# Patient Record
Sex: Male | Born: 1965 | Smoking: Never smoker
Health system: Southern US, Community
[De-identification: ages and names within clinical notes are randomized; demographics above are authoritative.]

---

## 2017-07-04 ENCOUNTER — Ambulatory Visit: Payer: Self-pay

## 2017-07-04 ENCOUNTER — Ambulatory Visit: Payer: BLUE CROSS/BLUE SHIELD | Admitting: Sports Medicine

## 2017-07-04 ENCOUNTER — Encounter: Payer: Self-pay | Admitting: Sports Medicine

## 2017-07-04 VITALS — BP 130/82 | HR 57 | Ht 68.0 in | Wt 163.2 lb

## 2017-07-04 DIAGNOSIS — M25522 Pain in left elbow: Secondary | ICD-10-CM | POA: Diagnosis not present

## 2017-07-04 DIAGNOSIS — M722 Plantar fascial fibromatosis: Secondary | ICD-10-CM

## 2017-07-04 DIAGNOSIS — M7702 Medial epicondylitis, left elbow: Secondary | ICD-10-CM | POA: Diagnosis not present

## 2017-07-04 MED ORDER — NITROGLYCERIN 0.2 MG/HR TD PT24: MEDICATED_PATCH | TRANSDERMAL | 1 refills | Status: DC

## 2017-07-04 NOTE — Progress Notes (Signed)
Veverly FellsMichael D. Delorise Shinerigby, DO  [facility] [place] [phone]  Burnard LeighGlenn Ponzo - 52 y.o. male MRN [id number]  Date of birth: 11-10-1965  Visit Date: 07/04/2017  PCP: Cheral BayHawks, Aldene N, MD   Referred by: No ref. provider found   Scribe for today's visit: Stevenson ClinchBrandy Coleman, CMA     SUBJECTIVE:  Burnard LeighGlenn Dewalt is here for New Patient (Initial Visit) (foot pain)  His foot pain symptoms INITIALLY: Began about 1 yr ago. He is a runner. He broke his foot in April 2018.  Described as mild burning, radiating to heel Worsened with standing.  Improved with rest and elevation.  Additional associated symptoms include: Pt denies swelling around the foot/ankle.     At this time symptoms show no change compared to onset. He has been using compression, heat and ice with minimal relief.   Last Xray done July 2018, Dr. Elijah Birkom foot and ankle.    His LT elbow pain symptoms INITIALLY: Began August 2018 and began while swimming.  Described as mild aching, nonradiating Worsened with swimming Improved with rest Additional associated symptoms include: Pt denies decreased ROM and swelling.     At this time symptoms show no change compared to onset  He has not taken any OTC meds for the pain.    ROS Denies night time disturbances. Denies fevers, chills, or night sweats. Denies unexplained weight loss. Denies personal history of cancer. Denies changes in bowel or bladder habits. Denies recent unreported falls. Denies new or worsening dyspnea or wheezing. Denies headaches or dizziness.  Denies numbness, tingling or weakness  In the extremities.  Denies dizziness or presyncopal episodes Denies lower extremity edema     HISTORY & PERTINENT PRIOR DATA:  Prior History reviewed and updated per electronic medical record.  Significant history, findings, studies and interim changes include:  reports that  has never smoked. he has never used smokeless tobacco. No results for  input(s): HGBA1C, LABURIC, CREATINE in the last 8760 hours. No specialty comments available. Problem  Plantar Fascia Syndrome  Medial Epicondylitis of Left Elbow    OBJECTIVE:  VS:  HT:5\' 8"  (172.7 cm)   WT:163 lb 3.2 oz (74 kg)  BMI:24.82    BP:130/82  HR:(!) 57bpm  TEMP: ( )  RESP:96 %   PHYSICAL EXAM: Constitutional: WDWN, Non-toxic appearing. Psychiatric: Alert & appropriately interactive.  Not depressed or anxious appearing. Respiratory: No increased work of breathing.  Trachea Midline Eyes: Pupils are equal.  EOM intact without nystagmus.  No scleral icterus  NEUROVASCULAR exam: No clubbing or cyanosis appreciated No significant venous stasis changes Capillary Refill: normal, less than 2 seconds   Lower extremities are well aligned without significant edema.  DP PT pulses 2/4. Marked pain across the plantar aspect of the foot at the origin of the plantar fascia right greater than left.  There is a small amount of swelling over this area as well.  Dorsiflexion to 115 degrees bilaterally.  No significant pain with palpation of the Achilles.  Left upper extremity overall well aligned with good grip strength although painful with wrist extension and pain with textbook testing.  Marked pain over the lateral epicondyle.   ASSESSMENT & PLAN:   1. Left elbow pain   2. Medial epicondylitis of left elbow   3. Plantar fascia syndrome    ++++++++++++++++++++++++++++++++++++++++++++ Orders & Meds:  Orders Placed This Encounter  Procedures  . Misc procedure  . US LIMITED JOINT SPACE STRUCTURES UP LEFT(NO LINKED CHARGES)  Meds ordered this encounter  Medications  . nitroGLYCERIN (NITRODUR - DOSED IN MG/24 HR) 0.2 mg/hr patch    Sig: Place 1/4 to 1/2 of a patch over affected region. Remove and replace once daily.  Slightly alter skin placement daily    Dispense:  30 patch    Refill:  1    For musculoskeletal purposes.  Okay to cut patch.      ++++++++++++++++++++++++++++++++++++++++++++ PLAN:   No additional findings.  Plantar fascia syndrome Plantar fasciitis bilaterally right greater than left with underlying fat pad contusion/iatrogenic split. Therapeutic exercises reviewed long discussion today regarding appropriate treatment including compression, [name] exercises, icing and appropriate arch support.  He has over-the-counter arch support that provided significant benefit for him and he will continue with this at this time.  Can consider custom cushioned insoles if needed.  Medial epicondylitis of left elbow Fairly classic medial epicondylosis.  Nitro protocol and therapeutic exercises reviewed    Follow-up: Return in about 6 weeks (around 08/15/2017).   Pertinent documentation may be included in additional procedure notes, imaging studies, problem based documentation and patient instructions. Please see these sections of the encounter for additional information regarding this visit. CMA/ATC served as Neurosurgeon during this visit. History, Physical, and Plan performed by medical provider. Documentation and orders reviewed and attested to.      Andrena Mews, DO    [facility]

## 2017-07-04 NOTE — Patient Instructions (Addendum)
Continue with the compression and padding for your heel with the waffle heel cups  Do the exercises we reviewed for your calf muscles on a daily basis Exercises for your elbow are the Hammer exercises.  Placed quarter of the patch over the area of her elbow that is painful.   Nitroglycerin Protocol  Apply 1/4 nitroglycerin patch to affected area daily.  Change position of patch within the affected area every 24 hours.  You may experience a headache during the first 1-2 weeks of using the patch, these should subside.  If you experience headaches after beginning nitroglycerin patch treatment, you may take your preferred over the counter pain reliever.  Another side effect of the nitroglycerin patch is skin irritation or rash related to patch adhesive.  Please notify our office if you develop more severe headaches or rash, and stop the patch.  Tendon healing with nitroglycerin patch may require 12 to 24 weeks depending on the extent of injury.  Men should not use if taking Viagra, Cialis, or Levitra.   Do not use if you have migraines or rosacea.    Please perform the exercise program that we have prepared for you and gone over in detail on a daily basis.  In addition to the handout you were provided you can access your program through: [email or web address]   Your unique program code is: [id number] & [id number]

## 2017-08-03 ENCOUNTER — Encounter: Payer: Self-pay | Admitting: Sports Medicine

## 2017-08-07 DIAGNOSIS — M722 Plantar fascial fibromatosis: Secondary | ICD-10-CM | POA: Insufficient documentation

## 2017-08-07 DIAGNOSIS — M7702 Medial epicondylitis, left elbow: Secondary | ICD-10-CM | POA: Insufficient documentation

## 2017-08-07 NOTE — Assessment & Plan Note (Signed)
Plantar fasciitis bilaterally right greater than left with underlying fat pad contusion/iatrogenic split. Therapeutic exercises reviewed long discussion today regarding appropriate treatment including compression, [name] exercises, icing and appropriate arch support.  He has over-the-counter arch support that provided significant benefit for him and he will continue with this at this time.  Can consider custom cushioned insoles if needed.

## 2017-08-07 NOTE — Procedures (Signed)
LIMITED MSK ULTRASOUND OF FEET Images were obtained and interpreted by myself, Gaspar BiddingMichael Zakiah Beckerman, DO  Images have been saved and stored to PACS system. Images obtained on: GE S7 Ultrasound machine  FINDINGS:   Bilateral feet were examined with ultrasound revealed a significantly thickened right plantar fascia measuring 0.64 cm in greatest diameter.    There is also significant hypoechoic change within the fat pad and an apparent intrasubstance split consistent with fat pad split (stone bruise)  Left plantar fascia was measured at 0.41cm  IMPRESSION:  1. Bilateral plantar fasciitis 2. Right fat pad split

## 2017-08-07 NOTE — Procedures (Signed)
PROCEDURE NOTE: THERAPEUTIC EXERCISES (97110) 15 minutes spent for Therapeutic exercises as below and as referenced in the AVS. This included exercises focusing on stretching, strengthening, with significant focus on eccentric aspects.  Proper technique shown and discussed handout in great detail with ATC. All questions were discussed and answered.   Long term goals include an improvement in range of motion, strength, endurance as well as avoiding reinjury. Frequency of visits is one time as determined during today's  office visit. Frequency of exercises to be performed is as per handout.  EXERCISES REVIEWED:  [name] exercises  Achilles stretching  Tennis elbow strengthening including Hammer exercises and icing

## 2017-08-07 NOTE — Assessment & Plan Note (Signed)
Fairly classic medial epicondylosis.  Nitro protocol and therapeutic exercises reviewed

## 2017-08-15 ENCOUNTER — Encounter: Payer: Self-pay | Admitting: Sports Medicine

## 2017-08-15 ENCOUNTER — Ambulatory Visit: Payer: BLUE CROSS/BLUE SHIELD | Admitting: Sports Medicine

## 2017-08-15 ENCOUNTER — Ambulatory Visit: Payer: Self-pay

## 2017-08-15 VITALS — BP 130/80 | HR 48 | Ht 68.0 in | Wt 163.6 lb

## 2017-08-15 DIAGNOSIS — M722 Plantar fascial fibromatosis: Secondary | ICD-10-CM | POA: Diagnosis not present

## 2017-08-15 DIAGNOSIS — M7702 Medial epicondylitis, left elbow: Secondary | ICD-10-CM

## 2017-08-15 DIAGNOSIS — L909 Atrophic disorder of skin, unspecified: Secondary | ICD-10-CM

## 2017-08-15 DIAGNOSIS — M25522 Pain in left elbow: Secondary | ICD-10-CM | POA: Diagnosis not present

## 2017-08-15 NOTE — Procedures (Signed)
LIMITED MSK ULTRASOUND OF LEFT MEDIAL ELBOW & RIGHT PF Images were obtained and interpreted by myself, Gaspar BiddingMichael Briana Farner, DO  Images have been saved and stored to PACS system. Images obtained on: GE S7 Ultrasound machine  FINDINGS:   R Plantar Fascia = improved swelling within the fat pad and still measuring >0.52cm  Medial elbow with persistent hypo-echoic change at the origin of the flexor tendons. IMproved neovascularity  IMPRESSION:  1. R Plantar Fascitis with overlying fat pad contusion likely iatrogenic 2. Medial Epicondylosis, improving

## 2017-08-15 NOTE — Patient Instructions (Signed)

## 2017-08-15 NOTE — Progress Notes (Signed)
Mitchell Franco. [facility] at [facility] [phone]  Mitchell Franco - 52 y.o. male MRN [id number]  Date of birth: Nov 04, 1965  Visit Date: 08/15/2017  PCP: Cheral Bay, MD   Referred by: Cheral Bay, MD   Scribe for today's visit: Stevenson Clinch, CMA     SUBJECTIVE:  Mitchell Franco is here for Follow-up (L elbow pain)   07/04/17: His foot pain symptoms INITIALLY: Began about 1 yr ago. He is a runner. He broke his foot in April 2018.  Described as mild burning, radiating to heel Worsened with standing.  Improved with rest and elevation.  Additional associated symptoms include: Pt denies swelling around the foot/ankle.  At this time symptoms show no change compared to onset. He has been using compression, heat and ice with minimal relief.  Last Xray done July 2018, Dr. Elijah Birk foot and ankle.   His LT elbow pain symptoms INITIALLY: Began August 2018 and began while swimming.  Described as mild aching, nonradiating Worsened with swimming Improved with rest Additional associated symptoms include: Pt denies decreased ROM and swelling.  At this time symptoms show no change compared to onset  He has not taken any OTC meds for the pain.   08/15/17: Compared to the last office visit, his previously described symptoms are improving, still trigger after swimming.  Current symptoms are mild & are nonradiating He has been using Nitroglycerin patches, 1/4 patch, no HA or skin irritation. He has been doing HEP exercises with no trouble.   Compared to the last office visit, his previously described symptoms are improving, pain has mostly resolved, he is able to run with minimal pain.  Current symptoms are mild & are nonradiating He has been alternating heat and ice, doing [name] exercises and has arch support in shoes.    ROS Denies night time disturbances. Denies fevers, chills, or night sweats. Denies unexplained weight loss. Denies  personal history of cancer. Denies changes in bowel or bladder habits. Denies recent unreported falls. Denies new or worsening dyspnea or wheezing. Denies headaches or dizziness.  Denies numbness, tingling or weakness  In the extremities.  Denies dizziness or presyncopal episodes Denies lower extremity edema    HISTORY & PERTINENT PRIOR DATA:  Prior History reviewed and updated per electronic medical record.  Significant history, findings, studies and interim changes include:  reports that  has never smoked. he has never used smokeless tobacco. No results for input(s): HGBA1C, LABURIC, CREATINE in the last 8760 hours. No specialty comments available. No problems updated.  OBJECTIVE:  VS:  HT:5\' 8"  (172.7 cm)   WT:163 lb 9.6 oz (74.2 kg)  BMI:24.88    BP:130/80  HR:(!) 48bpm  TEMP: ( )  RESP:97 %   PHYSICAL EXAM: Constitutional: WDWN, Non-toxic appearing. Psychiatric: Alert & appropriately interactive.  Not depressed or anxious appearing. Respiratory: No increased work of breathing.  Trachea Midline Eyes: Pupils are equal.  EOM intact without nystagmus.  No scleral icterus  NEUROVASCULAR exam: No clubbing or cyanosis appreciated No significant venous stasis changes Capillary Refill: normal, less than 2 seconds   Left elbow: He has persistent small amount of pain with palpation of the medial epicondyles.  His strength with resisted wrist flexion elbow flexion extension is normal and essentially pain-free.  Ligamentously his elbow is stable. Right foot: Moderate TTP over the lateral aspect of the plantar fascial origin.  Overall significantly improved.  Improved dorsiflexion with straight and bent knee.  Ankle exam  is ligamentously stable with good intrinsic ankle strength.  Breakdown of the transverse arch is present with slight splay toe.  ASSESSMENT & PLAN:   1. Medial epicondylitis of left elbow   2. Plantar fascia syndrome   3. Left elbow pain   4. Plantar fat pad  atrophy    ++++++++++++++++++++++++++++++++++++++++++++ Orders & Meds:  Orders Placed This Encounter  Procedures  . US MSK POCT ULTRASOUND   No orders of the defined types were placed in this encounter.   ++++++++++++++++++++++++++++++++++++++++++++ PLAN: Overall he is done significantly better these past 6 weeks.  I am encouraged with how things appear on ultrasound however am cautious to have him discontinue therapeutic exercises and nitroglycerin protocol given the persistent findings.  I would like for him to continue this for an additional 6 weeks and plan to check in with him at that time and I am optimistic that we will be able to have him return to normal activities. With only minimal maintenance exercises at that time.  Follow-up: Return in about 6 weeks (around 09/26/2017) for repeat clinical exam, repeat diagnostic ultrasound.   Pertinent documentation may be included in additional procedure notes, imaging studies, problem based documentation and patient instructions. Please see these sections of the encounter for additional information regarding this visit. CMA/ATC served as Neurosurgeonscribe during this visit. History, Physical, and Plan performed by medical provider. Documentation and orders reviewed and attested to.      Andrena MewsMichael D Rigby, DO    [facility]

## 2017-08-16 ENCOUNTER — Encounter: Payer: Self-pay | Admitting: Sports Medicine

## 2017-09-26 ENCOUNTER — Encounter: Payer: Self-pay | Admitting: Sports Medicine

## 2017-09-26 ENCOUNTER — Ambulatory Visit: Payer: BLUE CROSS/BLUE SHIELD | Admitting: Sports Medicine

## 2017-09-26 VITALS — BP 110/76 | HR 51 | Ht 68.0 in | Wt 159.2 lb

## 2017-09-26 DIAGNOSIS — M7702 Medial epicondylitis, left elbow: Secondary | ICD-10-CM

## 2017-09-26 DIAGNOSIS — M25522 Pain in left elbow: Secondary | ICD-10-CM | POA: Diagnosis not present

## 2017-09-26 DIAGNOSIS — M722 Plantar fascial fibromatosis: Secondary | ICD-10-CM | POA: Diagnosis not present

## 2017-09-26 DIAGNOSIS — L909 Atrophic disorder of skin, unspecified: Secondary | ICD-10-CM | POA: Diagnosis not present

## 2017-09-26 DIAGNOSIS — M9907 Segmental and somatic dysfunction of upper extremity: Secondary | ICD-10-CM

## 2017-09-26 NOTE — Progress Notes (Signed)
[name]. [name] [facility] at [facility] [phone]  Matas Burrows - 52 y.o. male MRN [id number]  Date of birth: [birth year]  Visit Date: 09/26/2017  PCP: Cheral Bay, MD   Referred by: Cheral Bay, MD  Scribe for today's visit: Stevenson Clinch, CMA     SUBJECTIVE:  Mitchell Franco is here for Follow-up (L elbow pain, plantar fasciitis)  07/04/17: His foot pain symptoms INITIALLY: Began about 1 yr ago. He is a runner. He broke his foot in April 2018.  Described as mild burning, radiating to heel Worsened with standing.  Improved with rest and elevation.  Additional associated symptoms include: Pt denies swelling around the foot/ankle.  At this time symptoms show no change compared to onset. He has been using compression, heat and ice with minimal relief.  Last Xray done July 2018, Dr. Elijah Birk foot and ankle.   His LT elbow pain symptoms INITIALLY: Began August 2018 and began while swimming.  Described as mild aching, nonradiating Worsened with swimming Improved with rest Additional associated symptoms include: Pt denies decreased ROM and swelling.  At this time symptoms show no change compared to onset  He has not taken any OTC meds for the pain.   08/15/17: Compared to the last office visit, his previously described symptoms are improving, still trigger after swimming.  Current symptoms are mild & are nonradiating He has been using Nitroglycerin patches, 1/4 patch, no HA or skin irritation. He has been doing HEP exercises with no trouble.   Compared to the last office visit, his previously described symptoms are improving, pain has mostly resolved, he is able to run with minimal pain.  Current symptoms are mild & are nonradiating He has been alternating heat and ice, doing [name] exercises and has arch support in shoes.   09/26/17: Compared to the last office visit, his previously described symptoms of L elbow pain is improving.  Pain is triggered by swimming.  Current symptoms are mild & are nonradiating He has been following Nitro Protocol. He denies HA but has noticed a slight rash at placement site. He has been doing HEP intermittently.   Compared to the last office visit, his previously described symptoms (of bilateral foot pain) show no change. He has been running more recently.  Current symptoms are mild & are nonradiating He has been  Alternating heat and ice when needed. He has been doing [name] exercises and has arch support in his shoes.   ROS Denies night time disturbances. Denies fevers, chills, or night sweats. Denies unexplained weight loss. Denies personal history of cancer. Denies changes in bowel or bladder habits. Denies recent unreported falls. Denies new or worsening dyspnea or wheezing. Denies headaches or dizziness.  Denies numbness, tingling or weakness  In the extremities.  Denies dizziness or presyncopal episodes Denies lower extremity edema    HISTORY & PERTINENT PRIOR DATA:  Prior History reviewed and updated per electronic medical record.  Significant/pertinent history, findings, studies include:  reports that he has never smoked. He has never used smokeless tobacco. No results for input(s): HGBA1C, LABURIC, CREATINE in the last 8760 hours. No specialty comments available. No problems updated.  OBJECTIVE:  VS:  HT:5\' 8"  (172.7 cm)   WT:159 lb 3.2 oz (72.2 kg)  BMI:24.21    BP:110/76  HR:(Abnormal) 51bpm  TEMP: ( )  RESP:96 %   PHYSICAL EXAM: Constitutional: WDWN, Non-toxic appearing. Psychiatric: Alert & appropriately interactive.  Not depressed or anxious appearing.  Respiratory: No increased work of breathing.  Trachea Midline Eyes: Pupils are equal.  EOM intact without nystagmus.  No scleral icterus  Vascular Exam: warm to touch no edema  upper and lower extremity neuro exam: unremarkable normal strength normal sensation normal reflexes  MSK  Exam: Bilateral elbows overall well aligned.  Has a moderate degree of pain with palpation of the medial epicondyle and flexor tendons on the left greater than right arms.  Osteopathic findings per procedure note His bilateral Achilles are nontender and he has a overall good improvement with his plantar fascial pain.   ASSESSMENT & PLAN:   1. Somatic dysfunction of upper extremity     PLAN:>50% of this 25 minute visit spent in direct patient counseling and/or coordination of care.  Discussion was focused on education regarding the in discussing the pathoetiology and anticipated clinical course of the above condition.  Overall he has made good improvements over the past several months and has learned what he can and cannot do from activity modification standpoint.  Links to [employer] videos provided today per Patient Instructions.  These exercises were developed by Myles LippsEric Goodman, DC with a strong emphasis on core neuromuscular reducation and postural realignment through body-weight exercises.  Follow-up: Return in about 8 weeks (around 11/21/2017).       Please see additional documentation for Objective, Assessment and Plan sections. Pertinent additional documentation may be included in corresponding procedure notes, imaging studies, problem based documentation and patient instructions. Please see these sections of the encounter for additional information regarding this visit.  CMA/ATC served as Neurosurgeonscribe during this visit. History, Physical, and Plan performed by medical provider. Documentation and orders reviewed and attested to.      Andrena MewsMichael D Luma Clopper, DO    [facility] Physician

## 2017-09-26 NOTE — Progress Notes (Signed)
PROCEDURE NOTE : OSTEOPATHIC MANIPULATION The decision today to treat with Osteopathic Manipulative Therapy (OMT) was based on physical exam findings. Verbal consent was obtained following a discussion with the patient regarding the of risks, benefits and potential side effects, including an acute pain flare,post manipulation soreness and need for repeat treatments.     NONE  Manipulation was performed as below: Regions treated: Upper extremities OMT Techniques Used: HVLA, muscle energy and myofascial release  The patient tolerated the treatment well and reported Improved symptoms following treatment today. Patient was given medications, exercises, stretches and lifestyle modifications per AVS and verbally.   OSTEOPATHIC/STRUCTURAL EXAM:   Hyperflexed elbow with supinated hand position. Anterior radial head on the right and left.

## 2017-09-26 NOTE — Patient Instructions (Signed)

## 2017-10-20 ENCOUNTER — Encounter: Payer: Self-pay | Admitting: Sports Medicine

## 2017-11-02 ENCOUNTER — Other Ambulatory Visit: Payer: Self-pay | Admitting: Sports Medicine

## 2017-11-21 ENCOUNTER — Ambulatory Visit: Payer: BLUE CROSS/BLUE SHIELD | Admitting: Sports Medicine

## 2018-06-15 ENCOUNTER — Encounter: Payer: Self-pay | Admitting: Sports Medicine

## 2018-06-15 ENCOUNTER — Ambulatory Visit: Payer: Self-pay

## 2018-06-15 ENCOUNTER — Ambulatory Visit: Payer: BLUE CROSS/BLUE SHIELD | Admitting: Sports Medicine

## 2018-06-15 VITALS — BP 124/80 | HR 55 | Ht 68.0 in | Wt 162.6 lb

## 2018-06-15 DIAGNOSIS — M722 Plantar fascial fibromatosis: Secondary | ICD-10-CM

## 2018-06-15 DIAGNOSIS — M25522 Pain in left elbow: Secondary | ICD-10-CM

## 2018-06-15 DIAGNOSIS — M7702 Medial epicondylitis, left elbow: Secondary | ICD-10-CM

## 2018-06-15 NOTE — Progress Notes (Signed)
Mitchell Franco. [name] at [facility] [phone]  Mitchell Franco - 53 y.o. male MRN [id number]  Date of birth: [birth year]  Visit Date:   PCP: Cheral Bay, MD   Referred by: Cheral Bay, MD   SUBJECTIVE:  Chief Complaint  Patient presents with  . Follow-up    R foot pain and L elbow pain   HPI: Patient presents for recurrent left elbow pain that has been worsening since increasing his swimming.  He is having an aching type pain.  He has used the nitroglycerin patches in the past and resumed this 5 days ago.  He did have good relief with nitroglycerin patches initially but since returning to the pool aggressively this is flared back up.  His right plantar fascia also has been improving and he only has minimal pain at this time.  REVIEW OF SYSTEMS: Per HPI   HISTORY:  Prior history reviewed and updated per electronic medical record.  Social History   Occupational History  . Not on file  Tobacco Use  . Smoking status: Never Smoker  . Smokeless tobacco: Never Used  Substance and Sexual Activity  . Alcohol use: Not on file  . Drug use: Not on file  . Sexual activity: Not on file   Social History   Social History Narrative  . Not on file    DATA OBTAINED & REVIEWED:  No results for input(s): HGBA1C, LABURIC, CREATINE, CALCIUM, AST, ALT, TSH in the last 8760 hours.  Invalid input(s): MAGNESIUM, CK No problems updated. No specialty comments available.  OBJECTIVE:  VS:  HT:5\' 8"  (172.7 cm)   WT:162 lb 9.6 oz (73.8 kg)  BMI:24.73    BP:124/80  HR:(!) 55bpm  TEMP: ( )  RESP:99 %   PHYSICAL EXAM: Adult male.  No acute distress.  Alert and appropriate. Left elbow with persistent pain along the medial epicondyle.  He has pain with resisted wrist flexion.  Ulnar deviation and radial deviation of the wrist does not cause any pain.  No significant pain with wrist extension.  There is fairly focal tightness within the  common flexor tendons as well as some fibrous changes appreciated.  No palpable defect appreciated.  There is marked tenderness along the medial calcaneus. Achilles is supple and Achilles is nontender.  Good motor control with dorsiflexion, plantarflexion, inversion and eversion.   ASSESSMENT  1. Left elbow pain   2. Plantar fascia syndrome   3. Medial epicondylitis of left elbow     PLAN:  Pertinent additional documentation may be included in corresponding procedure notes, imaging studies, problem based documentation and patient instructions. No problem-specific Assessment & Plan notes found for this encounter.  Overall his Achilles and plantar fascia are doing significantly better.  He does have markedly tight common flexor tendons on the left and will likely benefit from formal physical therapy with dry needling.  Referral for this was placed today.  He will resume nitroglycerin protocol.  Procedures:  None  No orders of the defined types were placed in this encounter.   Imaging Orders     [place] MSK POCT ULTRASOUND  Referral Orders     Ambulatory referral to Physical Therapy  Further Discussion/Instructions:  Activity modifications and the importance of avoiding exacerbating activities (limiting pain to no more than a 4 / 10 during or following activity) recommended and discussed. Discussed red flag symptoms that warrant earlier emergent evaluation and patient voices understanding.  If any lack  of improvement: consider Biologic Therapy with PRP   Return in about 6 weeks (around 07/27/2018).           Andrena MewsMichael D Dajsha Massaro, DO    [facility] Physician

## 2018-06-23 ENCOUNTER — Encounter: Payer: Self-pay | Admitting: Sports Medicine

## 2018-06-23 NOTE — Procedures (Signed)
LIMITED MSK ULTRASOUND OF Left elbow Images were obtained and interpreted by myself, Gaspar Bidding, DO  Images have been saved and stored to PACS system. Images obtained on: GE S7 Ultrasound machine  FINDINGS:   Common flexor tendon musculature has fragmentation at the insertion on the medial epicondyle.  Minimal neovascularity within this region.  Hypoechoic changes found within the common extensor tendon.  IMPRESSION:  1. Medial epicondylosis  LIMITED MSK ULTRASOUND OF Right plantar fascia. Images were obtained and interpreted by myself, Gaspar Bidding, DO  Images have been saved and stored to PACS system. Images obtained on: GE S7 Ultrasound machine  FINDINGS:   Overall improved thickness of the plantar fascia at the origin of the calcaneus however just distal to the osseous insertion there is still marked swelling measuring up to 0.84 cm.  Longitudinal arch appears to thinned out nicely compared to previously.  IMPRESSION:  2. 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

## 2018-07-04 ENCOUNTER — Ambulatory Visit (INDEPENDENT_AMBULATORY_CARE_PROVIDER_SITE_OTHER): Payer: BLUE CROSS/BLUE SHIELD | Admitting: Physical Therapy

## 2018-07-04 ENCOUNTER — Encounter: Payer: Self-pay | Admitting: Physical Therapy

## 2018-07-04 DIAGNOSIS — M25522 Pain in left elbow: Secondary | ICD-10-CM

## 2018-07-04 NOTE — Patient Instructions (Signed)
Access Code: [email or web address]  URL: [email or web address]  Date: 07/04/2018  Prepared by: Sedalia Muta   Exercises  Wrist Extension Stretch Pronated - 3 reps - 30 hold - 3x daily  Seated Wrist Flexion Extension PROM - 3 reps - 30 hold - 3x daily  Standing Wrist Extension Stretch - 3 reps - 30 hold - 3x daily  Wrist Prayer Stretch - 3 reps - 30 hold - 3x daily

## 2018-07-04 NOTE — Therapy (Signed)
[facility] [address] [place], [place], [address] Phone: [phone]   Fax:  [phone]  Physical Therapy Evaluation  Patient Details  Name: Mitchell Franco MRN: [id number] Date of Birth: [birth year] Referring Provider (PT): Danelle Earthlyigby   Encounter Date: 07/04/2018  PT End of Session - 07/04/18 1222    Visit Number  1    Number of Visits  12    Date for PT Re-Evaluation  08/15/18    Authorization Type  BCBS    PT Start Time  1215    PT Stop Time  1253    PT Time Calculation (min)  38 min    Activity Tolerance  Patient tolerated treatment well    Behavior During Therapy  [facility] for tasks assessed/performed       History reviewed. No pertinent past medical history.  History reviewed. No pertinent surgical history.  There were no vitals filed for this visit.   Subjective Assessment - 07/04/18 1218    Subjective  Pt states increased pain in l elbow, for about 1 year. He broke foot, and started swimming more. Has had increased pain recently. 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 He states minimal pain at work, but does have pain with gripping. He also swims, and bikes.     Limitations  Lifting;Writing;House hold activities;Sitting    Patient Stated Goals  Decreased pain     Currently in Pain?  Yes    Pain Score  6     Pain Location  Elbow    Pain Orientation  Left;Medial    Pain Descriptors / Indicators  Aching    Pain Type  Chronic pain    Pain Onset  More than a month ago    Pain Frequency  Intermittent         [facility] PT Assessment - 07/04/18 0001      Assessment   Medical Diagnosis  L elbow Pain    Referring Provider (PT)  Berline Choughigby    Hand Dominance  Right    Prior Therapy  no      Balance Screen   Has the patient fallen in the past 6 months  No      Prior Function   Level of Independence  Independent      Cognition   Overall Cognitive Status  Within Functional Limits for tasks assessed      ROM /  Strength   AROM / PROM / Strength  AROM;Strength      AROM   Overall AROM Comments  Shoulder: WNL; L  Elbow: mild limitation for full extension due to pain;  L wrist: mild limitation for extension due to pain;       Strength   Overall Strength Comments  Shoulder: 4+/5; Elbow: 4/5; Wrist: 4/5      Palpation   Palpation comment  Significant pain at medial flexor wod, Mild pain at lateral epicondyle;       Special Tests   Other special tests  Painful resited wrist flexion, pain with full wrist extension; Pain with gripping;                 Objective measurements completed on examination: See above findings.      [facility] Adult PT Treatment/Exercise - 07/04/18 0001      Exercises   Exercises  Elbow;Wrist      Wrist Exercises   Other wrist exercises  Wrist extension and flexion stretches, Elbow straight,  30 sec x2 each on L;  Prayer stretch 30 sec x3;       Modalities   Modalities  Iontophoresis      Iontophoresis   Type of Iontophoresis  Dexamethasone    Location  L medial elbow    Time  4 hour patch      Manual Therapy   Manual Therapy  Passive ROM;Soft tissue mobilization    Soft tissue mobilization  DTM and IASTM to L medial elbow    Passive ROM  L elbow extension and L wrist extension             PT Education - 07/04/18 1222    Education Details  PT POC, HEP    Person(s) Educated  Patient    Methods  Explanation;Handout    Comprehension  Verbalized understanding;Need further instruction       PT Short Term Goals - 07/04/18 1257      PT SHORT TERM GOAL #1   Title  Pt to be independent with initial HEP    Time  2    Period  Weeks    Status  New    Target Date  07/18/18        PT Long Term Goals - 07/04/18 1258      PT LONG TERM GOAL #1   Title  Pt to report decreased pain in elbow to 0-2/10 with activity    Time  6    Period  Weeks    Status  New    Target Date  08/15/18      PT LONG TERM GOAL #2   Title  Pt to demo decreased soft  tissue restrictions in L forearm ,to be WNL for decreased pain and improved mobiltiy    Time  6    Period  Weeks    Status  New    Target Date  08/15/18      PT LONG TERM GOAL #3   Title  Pt to be independent with final HEP     Time  6    Period  Weeks    Status  New    Target Date  08/15/18             Plan - 07/04/18 1301    Clinical Impression Statement  Pt presents with primary complaint of increased pain in L medial elbow. He has tightness in wrist flexors, bicep, and Pec. He has pain with passive wrist extension at medial elbow. Pt with lack of effective HEP for his dx. He has mild weakness in foearm and grip due to pain. Pt with decreased ability for full functional activites and work duties, due to pain. Pt to benefit from skilled PT to improve pain and return to PLOF.     Clinical Presentation  Stable    Clinical Decision Making  Low    Rehab Potential  Good    PT Frequency  2x / week    PT Duration  6 weeks    PT Treatment/Interventions  ADLs/Self Care Home Management;Cryotherapy;Electrical Stimulation;Iontophoresis 4mg /ml Dexamethasone;Functional mobility training;Ultrasound;Moist Heat;Therapeutic activities;Therapeutic exercise;Neuromuscular re-education;Patient/family education;Passive range of motion;Manual techniques;Dry needling;Taping    Consulted and Agree with Plan of Care  Patient       Patient will benefit from skilled therapeutic intervention in order to improve the following deficits and impairments:  Increased muscle spasms, Decreased activity tolerance, Pain, Impaired flexibility, Decreased strength  Visit Diagnosis: Left elbow pain     Problem List Patient Active Problem  List   Diagnosis Date Noted  . Plantar fascia syndrome 08/07/2017  . Medial epicondylitis of left elbow 08/07/2017    Sedalia Muta, PT, DPT 1:09 PM  07/04/18    [facility] [address] [place], [place], [address] Phone:  [phone]   Fax:  [phone]  Name: Mitchell Franco MRN: [id number] Date of Birth: [birth year]

## 2018-07-11 ENCOUNTER — Encounter: Payer: Self-pay | Admitting: Physical Therapy

## 2018-07-11 ENCOUNTER — Ambulatory Visit (INDEPENDENT_AMBULATORY_CARE_PROVIDER_SITE_OTHER): Payer: BLUE CROSS/BLUE SHIELD | Admitting: Physical Therapy

## 2018-07-11 DIAGNOSIS — M25522 Pain in left elbow: Secondary | ICD-10-CM | POA: Diagnosis not present

## 2018-07-11 NOTE — Therapy (Signed)
[facility] [address] [place], [place], [address] Phone: [phone]   Fax:  [phone]  Physical Therapy Treatment  Patient Details  Name: Mitchell Franco MRN: [id number] Date of Birth: [birth year] Referring Provider (PT): Danelle Earthly Date: 07/11/2018  PT End of Session - 07/11/18 2149    Visit Number  2    Number of Visits  12    Date for PT Re-Evaluation  08/15/18    Authorization Type  BCBS    PT Start Time  1432    PT Stop Time  1513    PT Time Calculation (min)  41 min    Activity Tolerance  Patient tolerated treatment well    Behavior During Therapy  [facility] for tasks assessed/performed       History reviewed. No pertinent past medical history.  History reviewed. No pertinent surgical history.  There were no vitals filed for this visit.  Subjective Assessment - 07/11/18 2148    Subjective  Pt states mild soreness today    Currently in Pain?  Yes    Pain Score  4     Pain Location  Elbow    Pain Orientation  Left;Medial;Lateral    Pain Descriptors / Indicators  Aching    Pain Type  Chronic pain    Pain Onset  More than a month ago    Pain Frequency  Intermittent                       [facility] Adult PT Treatment/Exercise - 07/11/18 2143      Exercises   Exercises  Elbow;Wrist      Wrist Exercises   Other wrist exercises  Wrist extension and flexion stretches, Elbow straight, 30 sec x2 each on L;      Other wrist exercises  Supine medial nerve glide x20;  Seated Ulnar nerve glide x20;       Modalities   Modalities  Iontophoresis      Iontophoresis   Type of Iontophoresis  Dexamethasone    Location  --    Time  --      Manual Therapy   Manual Therapy  Passive ROM;Soft tissue mobilization;Joint mobilization    Joint Mobilization  elbow distraction     Soft tissue mobilization  DTM and IASTM to L medial and lateral elbow    Passive ROM  L elbow extension and L wrist extension stretching.         Trigger Point Dry Needling - 07/11/18 2146    Consent Given?  Yes    Education Handout Provided  Yes    Muscles Treated Upper Body  --   ECRB;  Pronator, flexor carpi radialis            PT Short Term Goals - 07/04/18 1257      PT SHORT TERM GOAL #1   Title  Pt to be independent with initial HEP    Time  2    Period  Weeks    Status  New    Target Date  07/18/18        PT Long Term Goals - 07/04/18 1258      PT LONG TERM GOAL #1   Title  Pt to report decreased pain in elbow to 0-2/10 with activity    Time  6    Period  Weeks    Status  New    Target Date  08/15/18  PT LONG TERM GOAL #2   Title  Pt to demo decreased soft tissue restrictions in L forearm ,to be WNL for decreased pain and improved mobiltiy    Time  6    Period  Weeks    Status  New    Target Date  08/15/18      PT LONG TERM GOAL #3   Title  Pt to be independent with final HEP     Time  6    Period  Weeks    Status  New    Target Date  08/15/18            Plan - 07/11/18 2151    Clinical Impression Statement  Pt with sigificant pain with palpation of medial flexors and lateral extensors. Dry needling and manual therapy done today for pain and tightness. Reviewed stretches and added nerve glides. Plan to progress as tolerated.     Rehab Potential  Good    PT Frequency  2x / week    PT Duration  6 weeks    PT Treatment/Interventions  ADLs/Self Care Home Management;Cryotherapy;Electrical Stimulation;Iontophoresis 4mg /ml Dexamethasone;Functional mobility training;Ultrasound;Moist Heat;Therapeutic activities;Therapeutic exercise;Neuromuscular re-education;Patient/family education;Passive range of motion;Manual techniques;Dry needling;Taping    Consulted and Agree with Plan of Care  Patient       Patient will benefit from skilled therapeutic intervention in order to improve the following deficits and impairments:  Increased muscle spasms, Decreased activity tolerance, Pain, Impaired  flexibility, Decreased strength  Visit Diagnosis: Left elbow pain     Problem List Patient Active Problem List   Diagnosis Date Noted  . Plantar fascia syndrome 08/07/2017  . Medial epicondylitis of left elbow 08/07/2017    Sedalia Muta, PT, DPT 9:52 PM  07/11/18    [facility] [address] [place], [place], [address] Phone: [phone]   Fax:  [phone]  Name: Mitchell Franco MRN: [id number] Date of Birth: [birth year]

## 2018-07-12 ENCOUNTER — Ambulatory Visit (INDEPENDENT_AMBULATORY_CARE_PROVIDER_SITE_OTHER): Payer: BLUE CROSS/BLUE SHIELD | Admitting: Physical Therapy

## 2018-07-12 DIAGNOSIS — M25522 Pain in left elbow: Secondary | ICD-10-CM | POA: Diagnosis not present

## 2018-07-15 ENCOUNTER — Encounter: Payer: Self-pay | Admitting: Physical Therapy

## 2018-07-15 NOTE — Therapy (Signed)
[facility] [address] [place], [place], [address] Phone: [phone]   Fax:  [phone]  Physical Therapy Treatment  Patient Details  Name: Mitchell Franco MRN: [id number] Date of Birth: 09/19/1965 Referring Provider (PT): Danelle Earthly Date: 07/12/2018  PT End of Session - 07/15/18 1502    Visit Number  3    Number of Visits  12    Date for PT Re-Evaluation  08/15/18    Authorization Type  BCBS    PT Start Time  0232    PT Stop Time  0310    PT Time Calculation (min)  38 min    Activity Tolerance  Patient tolerated treatment well    Behavior During Therapy  [facility] for tasks assessed/performed       History reviewed. No pertinent past medical history.  History reviewed. No pertinent surgical history.  There were no vitals filed for this visit.  Subjective Assessment - 07/15/18 1501    Subjective  Pt states soreness, more on lateral elbow today.     Limitations  Lifting;Writing;House hold activities;Sitting    Patient Stated Goals  Decreased pain     Currently in Pain?  Yes    Pain Score  3     Pain Location  Elbow    Pain Orientation  Left;Medial;Lateral    Pain Descriptors / Indicators  Aching    Pain Type  Chronic pain    Pain Onset  More than a month ago    Pain Frequency  Intermittent                       [facility] Adult PT Treatment/Exercise - 07/15/18 0001      Exercises   Exercises  Elbow;Wrist      Wrist Exercises   Other wrist exercises  Wrist extension and flexion stretches, Elbow straight, 30 sec x2 each on L;      Other wrist exercises  Supine medial nerve glide x20;  Seated Ulnar nerve glide x20;       Modalities   Modalities  Iontophoresis      Iontophoresis   Type of Iontophoresis  Dexamethasone    Location  L medial elbow    Time  4 hour patch      Manual Therapy   Manual Therapy  Passive ROM;Soft tissue mobilization;Joint mobilization    Joint Mobilization  elbow distraction      Soft tissue mobilization  DTM and IASTM to L medial and lateral elbow    Passive ROM  L elbow extension and L wrist extension stretching.        Trigger Point Dry Needling - 07/15/18 1507    Consent Given?  Yes    Muscles Treated Upper Body  --   ECRB- Left , twitch response elicited.             PT Short Term Goals - 07/04/18 1257      PT SHORT TERM GOAL #1   Title  Pt to be independent with initial HEP    Time  2    Period  Weeks    Status  New    Target Date  07/18/18        PT Long Term Goals - 07/04/18 1258      PT LONG TERM GOAL #1   Title  Pt to report decreased pain in elbow to 0-2/10 with activity    Time  6    Period  Weeks    Status  New    Target Date  08/15/18      PT LONG TERM GOAL #2   Title  Pt to demo decreased soft tissue restrictions in L forearm ,to be WNL for decreased pain and improved mobiltiy    Time  6    Period  Weeks    Status  New    Target Date  08/15/18      PT LONG TERM GOAL #3   Title  Pt to be independent with final HEP     Time  6    Period  Weeks    Status  New    Target Date  08/15/18            Plan - 07/15/18 1505    Clinical Impression Statement  Pt with mild improvement with medial elbow pain. Additional DN done for ECRB today, due to continued soreness. Ionto done for medial elbow. Pt to benefit from continued care.     Rehab Potential  Good    PT Frequency  2x / week    PT Duration  6 weeks    PT Treatment/Interventions  ADLs/Self Care Home Management;Cryotherapy;Electrical Stimulation;Iontophoresis 4mg /ml Dexamethasone;Functional mobility training;Ultrasound;Moist Heat;Therapeutic activities;Therapeutic exercise;Neuromuscular re-education;Patient/family education;Passive range of motion;Manual techniques;Dry needling;Taping    Consulted and Agree with Plan of Care  Patient       Patient will benefit from skilled therapeutic intervention in order to improve the following deficits and impairments:   Increased muscle spasms, Decreased activity tolerance, Pain, Impaired flexibility, Decreased strength  Visit Diagnosis: Left elbow pain     Problem List Patient Active Problem List   Diagnosis Date Noted  . Plantar fascia syndrome 08/07/2017  . Medial epicondylitis of left elbow 08/07/2017   Sedalia Muta, PT, DPT 3:09 PM  07/15/18    [facility] [place] [facility] [address] [place], [place], [address] Phone: [phone]   Fax:  [phone]  Name: Mitchell Franco MRN: [id number] Date of Birth: Apr 28, 1966

## 2018-07-17 ENCOUNTER — Ambulatory Visit (INDEPENDENT_AMBULATORY_CARE_PROVIDER_SITE_OTHER): Payer: BLUE CROSS/BLUE SHIELD | Admitting: Physical Therapy

## 2018-07-17 ENCOUNTER — Encounter: Payer: Self-pay | Admitting: Physical Therapy

## 2018-07-17 DIAGNOSIS — M25522 Pain in left elbow: Secondary | ICD-10-CM

## 2018-07-17 NOTE — Therapy (Signed)
[facility] [address] [place], [place], [address] Phone: [phone]   Fax:  [phone]  Physical Therapy Treatment  Patient Details  Name: Mitchell LeighGlenn Trahan MRN: [id number] Date of Birth: [birth year] Referring Provider (PT): Danelle Earthlyigby   Encounter Date: 07/17/2018  PT End of Session - 07/17/18 1032    Visit Number  4    Number of Visits  12    Date for PT Re-Evaluation  08/15/18    Authorization Type  BCBS    PT Start Time  1022    PT Stop Time  1105    PT Time Calculation (min)  43 min    Activity Tolerance  Patient tolerated treatment well    Behavior During Therapy  [facility] for tasks assessed/performed       History reviewed. No pertinent past medical history.  History reviewed. No pertinent surgical history.  There were no vitals filed for this visit.  Subjective Assessment - 07/17/18 1038    Subjective  Pt states decreasing soreness overall. He did have increased pain wtih work duties today, at lateral elbow.     Patient Stated Goals  Decreased pain     Currently in Pain?  Yes    Pain Score  6     Pain Location  Elbow    Pain Orientation  Left;Medial;Lateral    Pain Descriptors / Indicators  Aching    Pain Type  Chronic pain    Pain Onset  More than a month ago    Pain Frequency  Intermittent                       [facility] Adult PT Treatment/Exercise - 07/17/18 0001      Exercises   Exercises  Elbow;Wrist      Elbow Exercises   Other elbow exercises  Standing Rows GTB x20 for posture;       Wrist Exercises   Wrist Flexion  20 reps    Bar Weights/Barbell (Wrist Flexion)  2 lbs    Wrist Flexion Limitations  eccentrics    Wrist Extension  20 reps    Bar Weights/Barbell (Wrist Extension)  2 lbs    Wrist Extension Limitations  eccentrics    Other wrist exercises  Wrist extension and flexion stretches, Elbow straight, 30 sec x2 each on L;      Other wrist exercises  --      Modalities   Modalities   Iontophoresis      Iontophoresis   Type of Iontophoresis  Dexamethasone    Location  L medial elbow    Time  4 hour patch      Manual Therapy   Manual Therapy  Passive ROM;Soft tissue mobilization;Joint mobilization    Manual therapy comments  skilled palaption and monitoring of softf tissue with dry needling.     Joint Mobilization  elbow distraction     Soft tissue mobilization  DTM and IASTM to L medial and lateral elbow    Passive ROM  L elbow extension and L wrist extension stretching.        Trigger Point Dry Needling - 07/17/18 1254    Consent Given?  Yes    Muscles Treated Upper Body  --   Pronator (medial elbow) on L; increased muscle length            PT Short Term Goals - 07/04/18 1257      PT SHORT TERM GOAL #1   Title  Pt to be independent with initial HEP    Time  2    Period  Weeks    Status  New    Target Date  07/18/18        PT Long Term Goals - 07/04/18 1258      PT LONG TERM GOAL #1   Title  Pt to report decreased pain in elbow to 0-2/10 with activity    Time  6    Period  Weeks    Status  New    Target Date  08/15/18      PT LONG TERM GOAL #2   Title  Pt to demo decreased soft tissue restrictions in L forearm ,to be WNL for decreased pain and improved mobiltiy    Time  6    Period  Weeks    Status  New    Target Date  08/15/18      PT LONG TERM GOAL #3   Title  Pt to be independent with final HEP     Time  6    Period  Weeks    Status  New    Target Date  08/15/18            Plan - 07/17/18 1251    Clinical Impression Statement  Pt with mild improvements in pain. Continues to have pain in pronator with resisted motion, and deep palpation. Addressed with dry needling today Manual therapy done for medial and lateral musculature. Ionto continued for medial pain. Pt started with light eccentric ther ex for pain. Plan to progress as tolerated.     Rehab Potential  Good    PT Frequency  2x / week    PT Duration  6 weeks    PT  Treatment/Interventions  ADLs/Self Care Home Management;Cryotherapy;Electrical Stimulation;Iontophoresis 4mg /ml Dexamethasone;Functional mobility training;Ultrasound;Moist Heat;Therapeutic activities;Therapeutic exercise;Neuromuscular re-education;Patient/family education;Passive range of motion;Manual techniques;Dry needling;Taping    Consulted and Agree with Plan of Care  Patient       Patient will benefit from skilled therapeutic intervention in order to improve the following deficits and impairments:  Increased muscle spasms, Decreased activity tolerance, Pain, Impaired flexibility, Decreased strength  Visit Diagnosis: Left elbow pain     Problem List Patient Active Problem List   Diagnosis Date Noted  . Plantar fascia syndrome 08/07/2017  . Medial epicondylitis of left elbow 08/07/2017    Sedalia Muta, PT, DPT 12:55 PM  07/17/18    [place] [facility] [address] [place], [place], [address] Phone: [phone]   Fax:  [phone]  Name: Lela Gochenaur MRN: [id number] Date of Birth: [birth year]

## 2018-07-19 ENCOUNTER — Ambulatory Visit (INDEPENDENT_AMBULATORY_CARE_PROVIDER_SITE_OTHER): Payer: BLUE CROSS/BLUE SHIELD | Admitting: Physical Therapy

## 2018-07-19 DIAGNOSIS — M25522 Pain in left elbow: Secondary | ICD-10-CM

## 2018-07-22 ENCOUNTER — Encounter: Payer: Self-pay | Admitting: Physical Therapy

## 2018-07-22 NOTE — Therapy (Signed)
[facility] [address] [place], [place], [address] Phone: [phone]   Fax:  [phone]  Physical Therapy Treatment  Patient Details  Name: Mitchell Franco MRN: [id number] Date of Birth: 11/22/1965 Referring Provider (PT): Danelle Earthly Date: 07/19/2018  PT End of Session - 07/22/18 1427    Visit Number  5    Number of Visits  12    Date for PT Re-Evaluation  08/15/18    Authorization Type  BCBS    PT Start Time  1345    PT Stop Time  1432    PT Time Calculation (min)  47 min    Activity Tolerance  Patient tolerated treatment well    Behavior During Therapy  [facility] for tasks assessed/performed       History reviewed. No pertinent past medical history.  History reviewed. No pertinent surgical history.  There were no vitals filed for this visit.  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 He has had decreasing pain overall.     Patient Stated Goals  Decreased pain     Currently in Pain?  Yes    Pain Score  3     Pain Location  Elbow    Pain Orientation  Right;Left    Pain Descriptors / Indicators  Aching    Pain Type  Chronic pain    Pain Onset  More than a month ago    Pain Frequency  Intermittent                       [facility] Adult PT Treatment/Exercise - 07/22/18 0001      Exercises   Exercises  Elbow;Wrist      Elbow Exercises   Other elbow exercises  Standing Rows BlTB x20, low rows GTB x20, scap pull outs YTB x20;       Wrist Exercises   Wrist Flexion  20 reps    Bar Weights/Barbell (Wrist Flexion)  3 lbs    Wrist Extension  20 reps    Bar Weights/Barbell (Wrist Extension)  3 lbs    Other wrist exercises  Wrist extension and flexion stretches, Elbow straight, 30 sec x2 each on L;        Modalities   Modalities  Iontophoresis      Iontophoresis   Type of Iontophoresis  Dexamethasone    Location  L medial  elbow    Time  4 hour patch      Manual Therapy   Manual Therapy  Passive ROM;Soft tissue mobilization;Joint mobilization    Joint Mobilization  elbow distraction     Soft tissue mobilization  DTM and IASTM to L medial and lateral elbow    Passive ROM  L elbow extension and L wrist extension stretching.                PT Short Term Goals - 07/04/18 1257      PT SHORT TERM GOAL #1   Title  Pt to be independent with initial HEP    Time  2    Period  Weeks    Status  New    Target Date  07/18/18        PT Long Term Goals - 07/04/18 1258      PT LONG TERM GOAL #1   Title  Pt to report decreased pain in elbow to  0-2/10 with activity    Time  6    Period  Weeks    Status  New    Target Date  08/15/18      PT LONG TERM GOAL #2   Title  Pt to demo decreased soft tissue restrictions in L forearm ,to be WNL for decreased pain and improved mobiltiy    Time  6    Period  Weeks    Status  New    Target Date  08/15/18      PT LONG TERM GOAL #3   Title  Pt to be independent with final HEP     Time  6    Period  Weeks    Status  New    Target Date  08/15/18            Plan - 07/22/18 1429    Clinical Impression Statement  Pt with decreasing pain wtih palaption and manual therapy. Manual therapy and Ionto continued for pain. Discussed at length work mechanics and forarm positioning for decreased pain. Pt progressing, plan to continue to work on decreasing pain, and improving strength.     Rehab Potential  Good    PT Frequency  2x / week    PT Duration  6 weeks    PT Treatment/Interventions  ADLs/Self Care Home Management;Cryotherapy;Electrical Stimulation;Iontophoresis 4mg /ml Dexamethasone;Functional mobility training;Ultrasound;Moist Heat;Therapeutic activities;Therapeutic exercise;Neuromuscular re-education;Patient/family education;Passive range of motion;Manual techniques;Dry needling;Taping    Consulted and Agree with Plan of Care  Patient       Patient will  benefit from skilled therapeutic intervention in order to improve the following deficits and impairments:  Increased muscle spasms, Decreased activity tolerance, Pain, Impaired flexibility, Decreased strength  Visit Diagnosis: Left elbow pain     Problem List Patient Active Problem List   Diagnosis Date Noted  . Plantar fascia syndrome 08/07/2017  . Medial epicondylitis of left elbow 08/07/2017    Sedalia Muta, PT, DPT 2:32 PM  07/22/18    [facility] [address] [place], [place], [address] Phone: [phone]   Fax:  [phone]  Name: Mitchell Franco MRN: [id number] Date of Birth: Jul 19, 1965

## 2018-07-23 ENCOUNTER — Encounter: Payer: Self-pay | Admitting: Sports Medicine

## 2018-07-23 ENCOUNTER — Ambulatory Visit: Payer: BLUE CROSS/BLUE SHIELD | Admitting: Sports Medicine

## 2018-07-23 VITALS — BP 110/78 | HR 51 | Ht 68.0 in | Wt 162.0 lb

## 2018-07-23 DIAGNOSIS — M722 Plantar fascial fibromatosis: Secondary | ICD-10-CM | POA: Diagnosis not present

## 2018-07-23 DIAGNOSIS — M9907 Segmental and somatic dysfunction of upper extremity: Secondary | ICD-10-CM

## 2018-07-23 DIAGNOSIS — M25522 Pain in left elbow: Secondary | ICD-10-CM

## 2018-07-23 DIAGNOSIS — M7702 Medial epicondylitis, left elbow: Secondary | ICD-10-CM

## 2018-07-23 NOTE — Progress Notes (Signed)
Veverly FellsMichael D. Delorise Shinerigby, DO  [facility] [place] [phone]  Burnard LeighGlenn Neidlinger - 53 y.o. male MRN [id number]  Date of birth: 01/24/1966  Visit Date: July 23, 2018  PCP: Jamal CollinHedgecock, Suzanne, PA-C   Referred by: Cheral BayHawks, Aldene N, MD  SUBJECTIVE:  Chief Complaint  Patient presents with  . Left Elbow - Follow-up  . Right Foot - Follow-up  . Follow-up    R foot pain.  Has arch supports.  [name]'s exercises.    HPI: Patient is here for follow-up of the above.  His right foot is asked doing quite well.  He is continuing to have some tightness and pain with certain motions with the medial aspect of the elbow but is markedly improved and is responded well to dry needling and physical therapy.  He has been over the change how he is performing some of the exacerbating activities and this is been beneficial as well.  He is not having any nighttime disturbances with this.  He is using the nitroglycerin protocol quarter of a patch without significant side effects  REVIEW OF SYSTEMS: No significant nighttime awakenings due to this issue. Denies fevers, chills, recent weight gain or weight loss.  No night sweats.  Pt denies any change in bowel or bladder habits, muscle weakness, numbness or falls associated with this pain.  HISTORY:  Prior history reviewed and updated per electronic medical record.  Patient Active Problem List   Diagnosis Date Noted  . Plantar fascia syndrome 08/07/2017  . Medial epicondylitis of left elbow 08/07/2017   Social History   Occupational History  . Not on file  Tobacco Use  . Smoking status: Never Smoker  . Smokeless tobacco: Never Used  Substance and Sexual Activity  . Alcohol use: Not on file  . Drug use: Not on file  . Sexual activity: Not on file   Social History   Social History Narrative  . Not on file    OBJECTIVE:  VS:  HT:5\' 8"  (172.7 cm)   WT:162 lb (73.5 kg)  BMI:24.64    BP:110/78  HR:(!) 51bpm   TEMP: ( )  RESP:98 %   PHYSICAL EXAM: Adult male. No acute distress.  Alert and appropriate. Left elbow is overall well aligned.  He does have markedly improved tissue texture of the medial forearm.  His pronator teres and bicipital aponeurosis that is causing some fascial adhesions to the common flexor tendons.  No pain with deep palpation of the biceps tendon and negative hook sign.  Mild focal tenderness directly over the medial epicondyle.     ASSESSMENT:   1. Left elbow pain   2. Plantar fascia syndrome   3. Medial epicondylitis of left elbow   4. Somatic dysfunction of upper extremity     PROCEDURES:  PROCEDURE NOTE: OSTEOPATHIC MANIPULATION  The decision today to treat with Osteopathic Manipulative Therapy (OMT) was based on physical exam findings. Verbal consent was obtained following a discussion with the patient regarding the of risks, benefits and potential side effects, including an acute pain flare,post manipulation soreness and need for repeat treatments.   Contraindications to OMT: NONE Manipulation was performed as below: [facility] Treated & Osteopathic Exam Findings UPPER EXTREMITIES: Tissue texture change and hyperflexed left wrist with pain directly over the common flexor tendons.  There is a trigger point within the common flexor tendons as well as the pronator teres.  OMT Techniques Used: HVLA muscle energy myofascial release  The patient tolerated the treatment  well and reported Improved symptoms following treatment today. Patient was given medications, exercises, stretches and lifestyle modifications per AVS and verbally.      PLAN:  Pertinent additional documentation may be included in corresponding procedure notes, imaging studies, problem based documentation and patient instructions.  No problem-specific Assessment & Plan notes found for this encounter.   Overall he is markedly improved.  We will have him continue with the home therapeutic exercises and  finish with physical therapy.  He is responding great to dry needling and and continue to encourage him to work on home massage to help break up some of the fascial adhesions that he has along the medial aspect of the elbow.  Overall this is markedly improved though and he is happy with his progress.  He should continue with his nitroglycerin at this time.  We did discuss the PRP as an option moving forward and if we have continued or incomplete healing he would consider doing this in the off season which is later in the fall.  He will follow-up with us again if any lack of improvement or worsening symptoms.  Continue previously prescribed home exercise program.   Activity modifications and the importance of avoiding exacerbating activities (limiting pain to no more than a 4 / 10 during or following activity) recommended and discussed.  Discussed red flag symptoms that warrant earlier emergent evaluation and patient voices understanding.   No orders of the defined types were placed in this encounter.  Lab Orders  No laboratory test(s) ordered today   Imaging Orders  No imaging studies ordered today   Referral Orders  No referral(s) requested today    No follow-ups on file.          Andrena MewsMichael D Rowan Pollman, DO    [facility]

## 2018-07-24 ENCOUNTER — Encounter: Payer: Self-pay | Admitting: Physical Therapy

## 2018-07-24 ENCOUNTER — Ambulatory Visit (INDEPENDENT_AMBULATORY_CARE_PROVIDER_SITE_OTHER): Payer: BLUE CROSS/BLUE SHIELD | Admitting: Physical Therapy

## 2018-07-24 DIAGNOSIS — M25522 Pain in left elbow: Secondary | ICD-10-CM | POA: Diagnosis not present

## 2018-07-24 NOTE — Therapy (Signed)
[facility] [address] [place], [place], [address] Phone: [phone]   Fax:  [phone]  Physical Therapy Treatment  Patient Details  Name: Mitchell Franco MRN: [id number] Date of Birth: [birth year] Referring Provider (PT): Danelle Earthlyigby   Encounter Date: 07/24/2018  PT End of Session - 07/24/18 1111    Visit Number  6    Number of Visits  12    Date for PT Re-Evaluation  08/15/18    Authorization Type  BCBS    PT Start Time  1103    PT Stop Time  1145    PT Time Calculation (min)  42 min    Activity Tolerance  Patient tolerated treatment well    Behavior During Therapy  [facility] for tasks assessed/performed       History reviewed. No pertinent past medical history.  History reviewed. No pertinent surgical history.  There were no vitals filed for this visit.  Subjective Assessment - 07/24/18 1110    Subjective  PT states improving pain. He has tried to use better mechanics at work. He feels most pain now at lateral elbow    Currently in Pain?  Yes    Pain Score  3     Pain Location  Elbow    Pain Orientation  Right;Left    Pain Descriptors / Indicators  Aching    Pain Type  Chronic pain    Pain Onset  More than a month ago    Aggravating Factors   work, pronation/supination. Elbow flexion                       [facility] Adult PT Treatment/Exercise - 07/24/18 1112      Exercises   Exercises  Elbow;Wrist      Elbow Exercises   Other elbow exercises  Standing Rows BlTB x20,     Other elbow exercises  Doorway stretch 90 and 45 deg 30 sec x2 each;       Wrist Exercises   Wrist Flexion  20 reps    Bar Weights/Barbell (Wrist Flexion)  3 lbs    Wrist Extension  20 reps    Bar Weights/Barbell (Wrist Extension)  3 lbs    Other wrist exercises  Wrist extension and flexion stretches, Elbow straight, 30 sec x2 each on L;  Wrist ext stretch kneeling x1 min;     Other wrist exercises  Self supnation stretch with over pressure 30  sec x3;       Modalities   Modalities  Iontophoresis      Iontophoresis   Type of Iontophoresis  Dexamethasone    Location  L lateral elbow    Time  4 hour patch      Manual Therapy   Manual Therapy  Passive ROM;Soft tissue mobilization;Joint mobilization    Manual therapy comments  skilled palaption and monitoring of softf tissue with dry needling.     Joint Mobilization  elbow distraction , posterior glides,     Soft tissue mobilization  DTM and IASTM to L medial and lateral elbow    Passive ROM  L elbow extension and L wrist extension stretching. , chest stretches, with active nerve glide x20;        Trigger Point Dry Needling - 07/24/18 1328    Consent Given?  Yes    Muscles Treated Upper Body  --   Lateral elbow, wrist extensors: increased muscle length (L)  PT Short Term Goals - 07/04/18 1257      PT SHORT TERM GOAL #1   Title  Pt to be independent with initial HEP    Time  2    Period  Weeks    Status  New    Target Date  07/18/18        PT Long Term Goals - 07/04/18 1258      PT LONG TERM GOAL #1   Title  Pt to report decreased pain in elbow to 0-2/10 with activity    Time  6    Period  Weeks    Status  New    Target Date  08/15/18      PT LONG TERM GOAL #2   Title  Pt to demo decreased soft tissue restrictions in L forearm ,to be WNL for decreased pain and improved mobiltiy    Time  6    Period  Weeks    Status  New    Target Date  08/15/18      PT LONG TERM GOAL #3   Title  Pt to be independent with final HEP     Time  6    Period  Weeks    Status  New    Target Date  08/15/18            Plan - 07/24/18 1323    Clinical Impression Statement  Pt with improving pain at medial elbow. Most pain today is at lateral elbow, extensor insertion. Dry needling, IASTM, ther ex and Ionto all main focus to decrease pain. Pt with decreasing tightness of all forearm muscles. Pt educated on stetching for tricep and for chest as well as  elbow/wrist. He has tightness and restriction in most UE and upper body musculature, particularly chest, and forearm. Pt to benefit from contineud care.     Rehab Potential  Good    PT Frequency  2x / week    PT Duration  6 weeks    PT Treatment/Interventions  ADLs/Self Care Home Management;Cryotherapy;Electrical Stimulation;Iontophoresis 4mg /ml Dexamethasone;Functional mobility training;Ultrasound;Moist Heat;Therapeutic activities;Therapeutic exercise;Neuromuscular re-education;Patient/family education;Passive range of motion;Manual techniques;Dry needling;Taping    Consulted and Agree with Plan of Care  Patient       Patient will benefit from skilled therapeutic intervention in order to improve the following deficits and impairments:  Increased muscle spasms, Decreased activity tolerance, Pain, Impaired flexibility, Decreased strength  Visit Diagnosis: Left elbow pain     Problem List Patient Active Problem List   Diagnosis Date Noted  . Plantar fascia syndrome 08/07/2017  . Medial epicondylitis of left elbow 08/07/2017    Sedalia Muta, PT, DPT 1:28 PM  07/24/18    [facility] [facility] [address] [place], [place], [address] Phone: [phone]   Fax:  [phone]  Name: Mitchell Franco MRN: [id number] Date of Birth: 1966/01/31

## 2018-07-26 ENCOUNTER — Encounter: Payer: Self-pay | Admitting: Physical Therapy

## 2018-07-26 ENCOUNTER — Ambulatory Visit (INDEPENDENT_AMBULATORY_CARE_PROVIDER_SITE_OTHER): Payer: BLUE CROSS/BLUE SHIELD | Admitting: Physical Therapy

## 2018-07-26 DIAGNOSIS — M25522 Pain in left elbow: Secondary | ICD-10-CM

## 2018-07-30 ENCOUNTER — Encounter: Payer: Self-pay | Admitting: Physical Therapy

## 2018-07-30 NOTE — Therapy (Signed)
[facility] [address] [place], [place], [address] Phone: [phone]   Fax:  [phone]  Physical Therapy Treatment  Patient Details  Name: Mitchell Franco MRN: [id number] Date of Birth: 09/24/1965 Referring Provider (PT): Danelle Earthly Date: 07/26/2018  PT End of Session - 07/30/18 1131    Visit Number  7    Number of Visits  12    Date for PT Re-Evaluation  08/15/18    Authorization Type  BCBS    PT Start Time  1021    PT Stop Time  1100    PT Time Calculation (min)  39 min    Activity Tolerance  Patient tolerated treatment well    Behavior During Therapy  [facility] for tasks assessed/performed       History reviewed. No pertinent past medical history.  History reviewed. No pertinent surgical history.  There were no vitals filed for this visit.  Subjective Assessment - 07/30/18 1130    Subjective  Pt was sore after last visit, better pain today. Most pain at lateral vs medial elbow today.     Limitations  Lifting;Writing;House hold activities;Sitting    Patient Stated Goals  Decreased pain     Currently in Pain?  Yes    Pain Score  3     Pain Location  Elbow    Pain Orientation  Left;Lateral    Pain Descriptors / Indicators  Aching    Pain Type  Chronic pain    Pain Onset  More than a month ago    Pain Frequency  Intermittent                       [facility] Adult PT Treatment/Exercise - 07/30/18 0001      Exercises   Exercises  Elbow;Wrist      Elbow Exercises   Other elbow exercises  Standing Rows BlTB x20, Bil ER RTB x20;  Scap pull outs RTB x20;     Other elbow exercises  Doorway stretch 90 and 45 deg 30 sec x3 each;       Wrist Exercises   Wrist Flexion  20 reps    Bar Weights/Barbell (Wrist Flexion)  3 lbs    Wrist Extension  20 reps    Bar Weights/Barbell (Wrist Extension)  3 lbs    Other wrist exercises  Wrist extension and flexion stretches, Elbow straight, 30 sec x2 each on L;  Wrist ext  stretch kneeling x1 min;     Other wrist exercises  Roll up/down (grip and twist) 3lb weight x15 each direction ;       Modalities   Modalities  Iontophoresis      Iontophoresis   Type of Iontophoresis  Dexamethasone    Location  L lateral elbow    Time  4 hour patch      Manual Therapy   Manual Therapy  Passive ROM;Soft tissue mobilization;Joint mobilization    Joint Mobilization  elbow distraction , posterior glides,     Soft tissue mobilization  DTM and IASTM to L medial and lateral elbow    Passive ROM  L elbow extension and L wrist extension stretching. , chest stretches, with active nerve glide x20;                PT Short Term Goals - 07/04/18 1257      PT SHORT TERM GOAL #1   Title  Pt to be independent with initial HEP  Time  2    Period  Weeks    Status  New    Target Date  07/18/18        PT Long Term Goals - 07/04/18 1258      PT LONG TERM GOAL #1   Title  Pt to report decreased pain in elbow to 0-2/10 with activity    Time  6    Period  Weeks    Status  New    Target Date  08/15/18      PT LONG TERM GOAL #2   Title  Pt to demo decreased soft tissue restrictions in L forearm ,to be WNL for decreased pain and improved mobiltiy    Time  6    Period  Weeks    Status  New    Target Date  08/15/18      PT LONG TERM GOAL #3   Title  Pt to be independent with final HEP     Time  6    Period  Weeks    Status  New    Target Date  08/15/18            Plan - 07/30/18 1132    Clinical Impression Statement  Pt with improvig pain at medial elbow, mild soreness with deep palpation, but most pain at lateral proximal elbow. Pt able to perform light strengthening with muscle fatigue, but no increased pain. Pt also states less pain when working due to changing hand Copy. Pt progressing well, pt to benefit from continued care.     Rehab Potential  Good    PT Frequency  2x / week    PT Duration  6 weeks    PT Treatment/Interventions   ADLs/Self Care Home Management;Cryotherapy;Electrical Stimulation;Iontophoresis 4mg /ml Dexamethasone;Functional mobility training;Ultrasound;Moist Heat;Therapeutic activities;Therapeutic exercise;Neuromuscular re-education;Patient/family education;Passive range of motion;Manual techniques;Dry needling;Taping    Consulted and Agree with Plan of Care  Patient       Patient will benefit from skilled therapeutic intervention in order to improve the following deficits and impairments:  Increased muscle spasms, Decreased activity tolerance, Pain, Impaired flexibility, Decreased strength  Visit Diagnosis: Left elbow pain     Problem List Patient Active Problem List   Diagnosis Date Noted  . Plantar fascia syndrome 08/07/2017  . Medial epicondylitis of left elbow 08/07/2017    Sedalia Muta, PT, DPT 11:36 AM  07/30/18    [facility] [place] [address] [place], [place], [address] Phone: [phone]   Fax:  [phone]  Name: Mitchell Franco MRN: [id number] Date of Birth: 06-17-1965

## 2018-07-31 ENCOUNTER — Ambulatory Visit (INDEPENDENT_AMBULATORY_CARE_PROVIDER_SITE_OTHER): Payer: BLUE CROSS/BLUE SHIELD | Admitting: Physical Therapy

## 2018-07-31 DIAGNOSIS — M25522 Pain in left elbow: Secondary | ICD-10-CM | POA: Diagnosis not present

## 2018-08-01 ENCOUNTER — Encounter: Payer: Self-pay | Admitting: Physical Therapy

## 2018-08-01 NOTE — Therapy (Signed)
[facility] [place] [facility] [address] [place], [place], [address] Phone: [phone]   Fax:  [phone]  Physical Therapy Treatment  Patient Details  Name: Mitchell Franco MRN: [id number] Date of Birth: 31-Aug-1965 Referring Provider (PT): Danelle Earthly Date: 07/31/2018  PT End of Session - 08/01/18 1152    Visit Number  8    Number of Visits  12    Date for PT Re-Evaluation  08/15/18    Authorization Type  BCBS    PT Start Time  1305    PT Stop Time  1353    PT Time Calculation (min)  48 min    Activity Tolerance  Patient tolerated treatment well    Behavior During Therapy  [facility] for tasks assessed/performed       History reviewed. No pertinent past medical history.  History reviewed. No pertinent surgical history.  There were no vitals filed for this visit.  Subjective Assessment - 08/01/18 1151    Subjective  Pt with most soreness at lateral elbow.     Limitations  Lifting;Writing;House hold activities;Sitting    Patient Stated Goals  Decreased pain     Currently in Pain?  Yes    Pain Score  2     Pain Location  Elbow    Pain Orientation  Left    Pain Descriptors / Indicators  Aching    Pain Type  Chronic pain    Pain Onset  More than a month ago    Pain Frequency  Intermittent    Aggravating Factors   lifting, pron/supination.     Pain Relieving Factors  rest                       [facility] Adult PT Treatment/Exercise - 07/31/18 1317      Exercises   Exercises  Elbow;Wrist      Elbow Exercises   Other elbow exercises  Standing Rows BlTB x20, Bil ER RTB x20;  Bicep Curl GTB x20;  Tricp push down TB x20;  Wall push ups x20;     Other elbow exercises  Doorway stretch 90 and 45 deg 30 sec x3 each;       Wrist Exercises   Wrist Flexion  --    Bar Weights/Barbell (Wrist Flexion)  --    Wrist Extension  --    Bar Weights/Barbell (Wrist Extension)  --    Other wrist exercises  Wrist extension and flexion stretches,  Elbow straight, 30 sec x2 each on L;  Wrist ext stretch kneeling x1 min;     Other wrist exercises  Roll up/down (grip and twist) 3lb weight x15 each direction ;       Modalities   Modalities  Iontophoresis      Iontophoresis   Type of Iontophoresis  Dexamethasone    Location  --    Time  --      Manual Therapy   Manual Therapy  Passive ROM;Soft tissue mobilization;Joint mobilization    Manual therapy comments  skilled palaption and monitoring of softf tissue with dry needling.     Joint Mobilization  elbow distraction , posterior glides,     Soft tissue mobilization  DTM and IASTM to L medial and lateral elbow    Passive ROM  L elbow extension and L wrist extension stretching. , chest stretches, with active nerve glide x20;        Trigger Point Dry Needling - 08/01/18 1157  Consent Given?  Yes    Muscles Treated Upper Body  --   Lateral elbow extensors: increased muscle length          PT Education - 08/01/18 1152    Education Details  HEP reviewed. Exercise, gripping and hand mechanics reviewed.     Person(s) Educated  Patient    Methods  Explanation    Comprehension  Verbalized understanding       PT Short Term Goals - 07/04/18 1257      PT SHORT TERM GOAL #1   Title  Pt to be independent with initial HEP    Time  2    Period  Weeks    Status  New    Target Date  07/18/18        PT Long Term Goals - 07/04/18 1258      PT LONG TERM GOAL #1   Title  Pt to report decreased pain in elbow to 0-2/10 with activity    Time  6    Period  Weeks    Status  New    Target Date  08/15/18      PT LONG TERM GOAL #2   Title  Pt to demo decreased soft tissue restrictions in L forearm ,to be WNL for decreased pain and improved mobiltiy    Time  6    Period  Weeks    Status  New    Target Date  08/15/18      PT LONG TERM GOAL #3   Title  Pt to be independent with final HEP     Time  6    Period  Weeks    Status  New    Target Date  08/15/18             Plan - 08/01/18 1154    Clinical Impression Statement  UE strengthening progressed today for surrounding muscles. Pt to benefit from increased strengthening for postural muscles. He continues to have tightness in forarem region, but is improving. Most pain is at lateral extensors today. Dry needling and manual focused here today. Pt progressing well, having decreased pain with daily activities, but increased pain with lifting, gripping, turning and higher level activities.     Rehab Potential  Good    PT Frequency  2x / week    PT Duration  6 weeks    PT Treatment/Interventions  ADLs/Self Care Home Management;Cryotherapy;Electrical Stimulation;Iontophoresis 4mg /ml Dexamethasone;Functional mobility training;Ultrasound;Moist Heat;Therapeutic activities;Therapeutic exercise;Neuromuscular re-education;Patient/family education;Passive range of motion;Manual techniques;Dry needling;Taping    Consulted and Agree with Plan of Care  Patient       Patient will benefit from skilled therapeutic intervention in order to improve the following deficits and impairments:  Increased muscle spasms, Decreased activity tolerance, Pain, Impaired flexibility, Decreased strength  Visit Diagnosis: Left elbow pain     Problem List Patient Active Problem List   Diagnosis Date Noted  . Plantar fascia syndrome 08/07/2017  . Medial epicondylitis of left elbow 08/07/2017    Sedalia Muta, PT, DPT 11:58 AM  08/01/18    [facility] [place] [facility] [address] [place], [place], [address] Phone: [phone]   Fax:  [phone]  Name: Mitchell Franco MRN: [id number] Date of Birth: 08-14-1965

## 2018-08-06 ENCOUNTER — Encounter: Payer: Self-pay | Admitting: Physical Therapy

## 2018-08-06 ENCOUNTER — Ambulatory Visit (INDEPENDENT_AMBULATORY_CARE_PROVIDER_SITE_OTHER): Payer: BLUE CROSS/BLUE SHIELD | Admitting: Physical Therapy

## 2018-08-06 DIAGNOSIS — M25522 Pain in left elbow: Secondary | ICD-10-CM

## 2018-08-07 ENCOUNTER — Encounter: Payer: Self-pay | Admitting: Physical Therapy

## 2018-08-07 NOTE — Therapy (Signed)
[facility] [place] [facility] [address] [place], [place], [address] Phone: [phone]   Fax:  [phone]  Physical Therapy Treatment  Patient Details  Name: Mitchell Franco MRN: [id number] Date of Birth: 1966-02-08 Referring Provider (PT): Danelle Earthly Date: 08/06/2018  PT End of Session - 08/07/18 1208    Visit Number  9    Number of Visits  12    Date for PT Re-Evaluation  08/15/18    Authorization Type  BCBS    PT Start Time  1432    PT Stop Time  1513    PT Time Calculation (min)  41 min    Activity Tolerance  Patient tolerated treatment well    Behavior During Therapy  [facility] for tasks assessed/performed       History reviewed. No pertinent past medical history.  History reviewed. No pertinent surgical history.  There were no vitals filed for this visit.  Subjective Assessment - 08/07/18 1207    Subjective  Pt states little pain at work this week, and over the weekend.    Patient Stated Goals  Decreased pain     Currently in Pain?  Yes    Pain Score  2     Pain Location  Elbow    Pain Orientation  Left    Pain Descriptors / Indicators  Aching    Pain Type  Chronic pain    Pain Onset  More than a month ago    Pain Frequency  Intermittent    Aggravating Factors   gripping, pron/supination                       [facility] Adult PT Treatment/Exercise - 08/06/18 1427      Exercises   Exercises  Elbow;Wrist      Elbow Exercises   Other elbow exercises  Standing Rows BlTB x20, Bil ER RTB x20;  Bicep Curl GTB x20;  Tricp push down TB x20;  modified push ups, wall x10, table x10;     Other elbow exercises  Doorway stretch 90 and 45 deg 30 sec x3 each;       Wrist Exercises   Other wrist exercises  Wrist extension and flexion stretches, Elbow straight, 30 sec x2 each on L;  Wrist ext stretch kneeling x1 min;     Other wrist exercises  Roll up/down (grip and twist) 3lb weight x15 each direction ;       Modalities   Modalities  Iontophoresis      Iontophoresis   Type of Iontophoresis  Dexamethasone      Manual Therapy   Manual Therapy  Passive ROM;Soft tissue mobilization;Joint mobilization    Manual therapy comments  --    Joint Mobilization  elbow distraction , posterior glides,     Soft tissue mobilization  DTM and IASTM to L medial and lateral elbow    Passive ROM  L elbow extension and L wrist extension stretching. , chest stretches, with active nerve glide x20;                PT Short Term Goals - 07/04/18 1257      PT SHORT TERM GOAL #1   Title  Pt to be independent with initial HEP    Time  2    Period  Weeks    Status  New    Target Date  07/18/18        PT Long Term Goals - 07/04/18  1258      PT LONG TERM GOAL #1   Title  Pt to report decreased pain in elbow to 0-2/10 with activity    Time  6    Period  Weeks    Status  New    Target Date  08/15/18      PT LONG TERM GOAL #2   Title  Pt to demo decreased soft tissue restrictions in L forearm ,to be WNL for decreased pain and improved mobiltiy    Time  6    Period  Weeks    Status  New    Target Date  08/15/18      PT LONG TERM GOAL #3   Title  Pt to be independent with final HEP     Time  6    Period  Weeks    Status  New    Target Date  08/15/18            Plan - 08/07/18 1254    Clinical Impression Statement  Pt improving on ability for strengthening, with decreased pain during and after activity. Pt requires mod cueing for posture and mechanics of Upper body with ther ex. Medial elbow pain much improved, mild pain remains at lateral elbow.     Rehab Potential  Good    PT Frequency  2x / week    PT Duration  6 weeks    PT Treatment/Interventions  ADLs/Self Care Home Management;Cryotherapy;Electrical Stimulation;Iontophoresis 4mg /ml Dexamethasone;Functional mobility training;Ultrasound;Moist Heat;Therapeutic activities;Therapeutic exercise;Neuromuscular re-education;Patient/family education;Passive  range of motion;Manual techniques;Dry needling;Taping    Consulted and Agree with Plan of Care  Patient       Patient will benefit from skilled therapeutic intervention in order to improve the following deficits and impairments:  Increased muscle spasms, Decreased activity tolerance, Pain, Impaired flexibility, Decreased strength  Visit Diagnosis: Left elbow pain     Problem List Patient Active Problem List   Diagnosis Date Noted  . Plantar fascia syndrome 08/07/2017  . Medial epicondylitis of left elbow 08/07/2017   Sedalia Muta, PT, DPT 12:55 PM  08/07/18    [facility] [place] [facility] [address] [place], [place], [address] Phone: [phone]   Fax:  [phone]  Name: Mitchell Franco MRN: [id number] Date of Birth: Apr 18, 1966

## 2018-08-08 ENCOUNTER — Encounter: Payer: Self-pay | Admitting: Physical Therapy

## 2018-08-08 ENCOUNTER — Ambulatory Visit (INDEPENDENT_AMBULATORY_CARE_PROVIDER_SITE_OTHER): Payer: BLUE CROSS/BLUE SHIELD | Admitting: Physical Therapy

## 2018-08-08 DIAGNOSIS — M25522 Pain in left elbow: Secondary | ICD-10-CM

## 2018-08-08 NOTE — Therapy (Signed)
[facility] [place] [facility] [address] [place], [place], [address] Phone: [phone]   Fax:  [phone]  Physical Therapy Treatment  Patient Details  Name: Mitchell Franco MRN: [id number] Date of Birth: 10-30-1965 Referring Provider (PT): Danelle Earthly Date: 08/08/2018  PT End of Session - 08/08/18 1417    Visit Number  10    Number of Visits  12    Date for PT Re-Evaluation  08/15/18    Authorization Type  BCBS    PT Start Time  1217    PT Stop Time  1300    PT Time Calculation (min)  43 min    Activity Tolerance  Patient tolerated treatment well    Behavior During Therapy  [facility] for tasks assessed/performed       History reviewed. No pertinent past medical history.  History reviewed. No pertinent surgical history.  There were no vitals filed for this visit.  Subjective Assessment - 08/08/18 1416    Subjective  Pt states minimal pain in last couple days.     Patient Stated Goals  Decreased pain     Currently in Pain?  Yes    Pain Score  1     Pain Location  Elbow    Pain Orientation  Left    Pain Descriptors / Indicators  Aching    Pain Type  Chronic pain                       [facility] Adult PT Treatment/Exercise - 08/08/18 1219      Exercises   Exercises  Elbow;Wrist      Elbow Exercises   Other elbow exercises  Standing Rows BlTB x20, ER /IR GTB x20;  Bicep Curl GTB x20;  Tricp push down TB x20;     Other elbow exercises  --      Wrist Exercises   Wrist Flexion  20 reps    Bar Weights/Barbell (Wrist Flexion)  3 lbs    Wrist Extension  20 reps    Bar Weights/Barbell (Wrist Extension)  3 lbs    Other wrist exercises  Wrist extension and flexion stretches, Elbow straight, 30 sec x2 each on L;  ; Gripping x20     Other wrist exercises  Roll up/down (grip and twist) 3lb weight x15 each direction ; Pron/sup with 3 lb weight x20;       Modalities   Modalities  Iontophoresis      Iontophoresis   Type of  Iontophoresis  Dexamethasone    Location  L lateral elbow    Time  4 hour patch      Manual Therapy   Manual Therapy  Passive ROM;Soft tissue mobilization;Joint mobilization    Joint Mobilization  --    Soft tissue mobilization  DTM to  lateral elbow    Passive ROM  L elbow extension and L wrist extension stretching. , chest stretches, with active nerve glide x20;                PT Short Term Goals - 07/04/18 1257      PT SHORT TERM GOAL #1   Title  Pt to be independent with initial HEP    Time  2    Period  Weeks    Status  New    Target Date  07/18/18        PT Long Term Goals - 07/04/18 1258      PT LONG TERM  GOAL #1   Title  Pt to report decreased pain in elbow to 0-2/10 with activity    Time  6    Period  Weeks    Status  New    Target Date  08/15/18      PT LONG TERM GOAL #2   Title  Pt to demo decreased soft tissue restrictions in L forearm ,to be WNL for decreased pain and improved mobiltiy    Time  6    Period  Weeks    Status  New    Target Date  08/15/18      PT LONG TERM GOAL #3   Title  Pt to be independent with final HEP     Time  6    Period  Weeks    Status  New    Target Date  08/15/18            Plan - 08/08/18 1418    Clinical Impression Statement  Pt improving with pain. Mild soreness with resisted pron/supination as well as gripping today. Plan to see for one more visit, will finalize HEP, and d/c.     Rehab Potential  Good    PT Frequency  2x / week    PT Duration  6 weeks    PT Treatment/Interventions  ADLs/Self Care Home Management;Cryotherapy;Electrical Stimulation;Iontophoresis 4mg /ml Dexamethasone;Functional mobility training;Ultrasound;Moist Heat;Therapeutic activities;Therapeutic exercise;Neuromuscular re-education;Patient/family education;Passive range of motion;Manual techniques;Dry needling;Taping    Consulted and Agree with Plan of Care  Patient       Patient will benefit from skilled therapeutic intervention in  order to improve the following deficits and impairments:  Increased muscle spasms, Decreased activity tolerance, Pain, Impaired flexibility, Decreased strength  Visit Diagnosis: Left elbow pain     Problem List Patient Active Problem List   Diagnosis Date Noted  . Plantar fascia syndrome 08/07/2017  . Medial epicondylitis of left elbow 08/07/2017    Sedalia Muta, PT, DPT 2:20 PM  08/08/18    [place] [facility] [address] [place], [place], [address] Phone: [phone]   Fax:  [phone]  Name: Mitchell Franco MRN: [id number] Date of Birth: 1965/08/03

## 2018-08-13 ENCOUNTER — Ambulatory Visit (INDEPENDENT_AMBULATORY_CARE_PROVIDER_SITE_OTHER): Payer: BLUE CROSS/BLUE SHIELD | Admitting: Physical Therapy

## 2018-08-13 ENCOUNTER — Encounter: Payer: Self-pay | Admitting: Physical Therapy

## 2018-08-13 DIAGNOSIS — M25522 Pain in left elbow: Secondary | ICD-10-CM | POA: Diagnosis not present

## 2018-08-14 NOTE — Therapy (Signed)
[facility] [address] [place], [place], [address] Phone: [phone]   Fax:  [phone]  Physical Therapy Treatment/Discharge   Patient Details  Name: Mitchell Franco MRN: [id number] Date of Birth: 1965/11/14 Referring Provider (PT): Karilyn Cota Date: 08/13/2018  PT End of Session - 08/13/18 1313    Visit Number  11    Number of Visits  12    Date for PT Re-Evaluation  08/15/18    Authorization Type  [employer]    PT Start Time  1308    PT Stop Time  1340    PT Time Calculation (min)  32 min    Activity Tolerance  Patient tolerated treatment well    Behavior During Therapy  [facility] for tasks assessed/performed       History reviewed. No pertinent past medical history.  History reviewed. No pertinent surgical history.  There were no vitals filed for this visit.  Subjective Assessment - 08/13/18 1312    Subjective  Pt states minimal pain , only feels it if he "moves wrong" at work.     Patient Stated Goals  Decreased pain     Currently in Pain?  No/denies    Pain Score  0-No pain         [facility] PT Assessment - 08/14/18 0001      AROM   Overall AROM Comments  wrist/elbow, forearm: WFL;       Strength   Overall Strength Comments  shoulder 5/5, wrist: 4+/5;  Elbow: 4+/5 : Grip: equal on L and R;        Palpation   Palpation comment  very mild pain with deep palpation of ;medial and lateral elbow.                    [facility] PT Treatment/Exercise - 08/13/18 1314      Exercises   Exercises  Elbow;Wrist      Elbow Exercises   Elbow Flexion  20 reps    Bar Weights/Barbell (Elbow Flexion)  4 lbs    Elbow Flexion Limitations  reg curel and hammer curl x20 each;     Other elbow exercises  Standing Rows BlTB x20,   Tricp push down TB x20;     Other elbow exercises  Doorway stretch 90 and 45 deg 30 sec x3 each;       Wrist Exercises   Wrist Flexion  20 reps    Bar Weights/Barbell (Wrist Flexion)  3 lbs    Wrist Extension  20 reps    Bar Weights/Barbell (Wrist Extension)  3 lbs    Other wrist exercises  Wrist extension and flexion stretches, Elbow straight, 30 sec x2 each on L;  ;     Other wrist exercises  Pron/sup with 3 lb weight x20; roll up/down with 3 lb weight x10 each way;       Modalities   Modalities  --      Iontophoresis   Type of Iontophoresis  --    Location  --    Time  --      Manual Therapy   Manual Therapy  Passive ROM;Soft tissue mobilization;Joint mobilization    Joint Mobilization  elbow distraction , posterior glides,     Soft tissue mobilization  --    Passive ROM  L elbow extension and L wrist extension stretching. , chest stretches, with active nerve glide x20;  PT Education - 08/13/18 1312    Education Details  HEP Reviewed.     Person(s) Educated  Patient    Methods  Explanation    Comprehension  Verbalized understanding       PT Short Term Goals - 08/14/18 1333      PT SHORT TERM GOAL #1   Title  Pt to be independent with initial HEP    Time  2    Period  Weeks    Status  Achieved    Target Date  07/18/18        PT Long Term Goals - 08/14/18 1333      PT LONG TERM GOAL #1   Title  Pt to report decreased pain in elbow to 0-2/10 with activity    Time  6    Period  Weeks    Status  Achieved      PT LONG TERM GOAL #2   Title  Pt to demo decreased soft tissue restrictions in L forearm ,to be WNL for decreased pain and improved mobiltiy    Time  6    Period  Weeks    Status  Achieved      PT LONG TERM GOAL #3   Title  Pt to be independent with final HEP     Time  6    Period  Weeks    Status  Achieved            Plan - 08/14/18 1334    Clinical Impression Statement  Pt has made good improvments. He has no pain with regular daily activities, and is mostly pain free with work duties. 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 FInal HEP reviewed in detial today, pt with good understanding. Goals met,  ready for d/c.     Rehab Potential  Good    PT Frequency  2x / week    PT Duration  6 weeks    PT Treatment/Interventions  ADLs/Self Care Home Management;Cryotherapy;Electrical Stimulation;Iontophoresis 35m/ml Dexamethasone;Functional mobility training;Ultrasound;Moist Heat;Therapeutic activities;Therapeutic exercise;Neuromuscular re-education;Patient/family education;Passive range of motion;Manual techniques;Dry needling;Taping    Consulted and Agree with Plan of Care  Patient       Patient will benefit from skilled therapeutic intervention in order to improve the following deficits and impairments:  Increased muscle spasms, Decreased activity tolerance, Pain, Impaired flexibility, Decreased strength  Visit Diagnosis: Left elbow pain     Problem List Patient Active Problem List   Diagnosis Date Noted  . Plantar fascia syndrome 08/07/2017  . Medial epicondylitis of left elbow 08/07/2017   LLyndee Hensen PT, DPT 1:39 PM  08/14/18   [facility] [place] [address]Phone: [phone]  Fax:  [phone] Name: Mitchell BuntMRN: [id number] of Birth: 603/17/1967    PHYSICAL THERAPY DISCHARGE SUMMARY  Visits from Start of Care:11  Plan: Patient agrees to discharge.  Patient goals were met. Patient is being discharged due to meeting the stated rehab goals.  ?????      LLyndee Hensen PT, DPT 1:39 PM  08/14/18

## 2018-08-15 ENCOUNTER — Encounter: Payer: BLUE CROSS/BLUE SHIELD | Admitting: Physical Therapy

## 2019-08-18 ENCOUNTER — Ambulatory Visit: Payer: BC Managed Care – PPO | Attending: Internal Medicine

## 2019-08-18 DIAGNOSIS — Z23 Encounter for immunization: Secondary | ICD-10-CM | POA: Insufficient documentation

## 2019-08-18 NOTE — Progress Notes (Signed)
   Covid-19 Vaccination Clinic  Name:  Lillard Bailon    MRN: [id number] DOB: [birth year]  08/18/2019  Mr. Luhmann was observed post Covid-19 immunization for 15 minutes without incident. He was provided with Vaccine Information Sheet and instruction to access the V-Safe system.   Mr. Cacciola was instructed to call 911 with any severe reactions post vaccine: Marland Kitchen Difficulty breathing  . Swelling of face and throat  . A fast heartbeat  . A bad rash all over body  . Dizziness and weakness   Immunizations Administered    Name Date Dose VIS Date Route   Pfizer COVID-19 Vaccine 08/18/2019 11:05 AM 0.3 mL 05/24/2019 Intramuscular   Manufacturer: [employer], [employer]   Lot: [id number]   NDC: [address]-8

## 2019-09-18 ENCOUNTER — Ambulatory Visit: Payer: BC Managed Care – PPO | Attending: Internal Medicine

## 2019-09-18 DIAGNOSIS — Z23 Encounter for immunization: Secondary | ICD-10-CM

## 2019-09-18 NOTE — Progress Notes (Signed)
   Covid-19 Vaccination Clinic  Name:  Mitchell Franco    MRN: [id number] DOB: Dec 29, 1965  09/18/2019  Mr. [name] was observed post Covid-19 immunization for 15 minutes without incident. He was provided with Vaccine Information Sheet and instruction to access the V-Safe system.   Mr. [name] was instructed to call 911 with any severe reactions post vaccine: Marland Kitchen Difficulty breathing  . Swelling of face and throat  . A fast heartbeat  . A bad rash all over body  . Dizziness and weakness   Immunizations Administered    Name Date Dose VIS Date Route   Pfizer COVID-19 Vaccine 09/18/2019  8:19 AM 0.3 mL 05/24/2019 Intramuscular   Manufacturer: [employer], [employer]   Lot: [id number]   NDC: [address]-0

## 2021-09-28 ENCOUNTER — Ambulatory Visit
Admission: RE | Admit: 2021-09-28 | Discharge: 2021-09-28 | Disposition: A | Discharge status: Home / Self Care | Payer: BC Managed Care – PPO | Source: Ambulatory Visit | Attending: Sports Medicine | Admitting: Sports Medicine

## 2021-09-28 ENCOUNTER — Other Ambulatory Visit: Payer: Self-pay | Admitting: Sports Medicine

## 2021-09-28 DIAGNOSIS — M25511 Pain in right shoulder: Secondary | ICD-10-CM

## 2021-10-07 ENCOUNTER — Other Ambulatory Visit: Payer: Self-pay | Admitting: Sports Medicine

## 2021-10-07 DIAGNOSIS — M75101 Unspecified rotator cuff tear or rupture of right shoulder, not specified as traumatic: Secondary | ICD-10-CM

## 2021-10-07 DIAGNOSIS — M25511 Pain in right shoulder: Secondary | ICD-10-CM

## 2021-10-13 ENCOUNTER — Ambulatory Visit
Admission: RE | Admit: 2021-10-13 | Discharge: 2021-10-13 | Disposition: A | Discharge status: Home / Self Care | Payer: BC Managed Care – PPO | Source: Ambulatory Visit | Attending: Sports Medicine | Admitting: Sports Medicine

## 2021-10-13 DIAGNOSIS — M25511 Pain in right shoulder: Secondary | ICD-10-CM

## 2021-10-13 DIAGNOSIS — M75101 Unspecified rotator cuff tear or rupture of right shoulder, not specified as traumatic: Secondary | ICD-10-CM

## 2021-10-17 ENCOUNTER — Other Ambulatory Visit: Payer: BC Managed Care – PPO

## 2022-05-15 IMAGING — MR MR SHOULDER*R* W/O CM
4 of 5 series · 21 of 40 positions shown · non-contrast
Comparison: X-ray shoulder 09/28/2021.

CLINICAL DATA: Generalized right shoulder and range of motion pain
especially with swimming.

EXAM:
MRI OF THE RIGHT SHOULDER WITHOUT CONTRAST
TECHNIQUE: Multiplanar, multisequence MR imaging of the shoulder was performed.
No intravenous contrast was administered.

[Series 6: T2 fat-sat · axial · right · 3.0mm · 0.47mm/px · z∈[-54,+34]mm · 8 of 27 slices shown (1 of 3)]
[im 1/27]
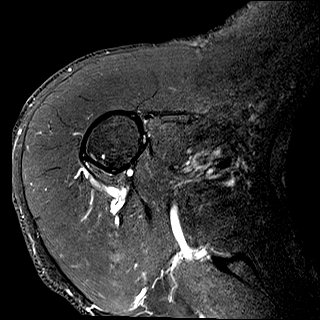
[im 3/27]
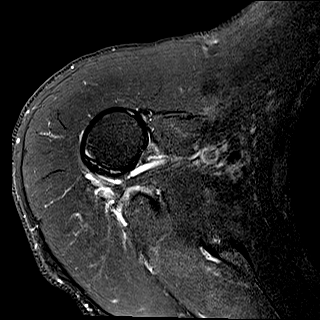
[im 6/27]
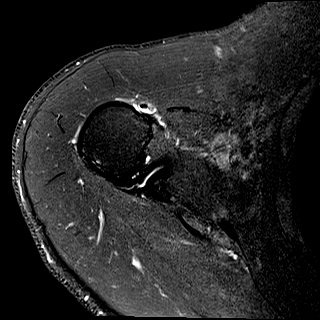
[im 8/27]
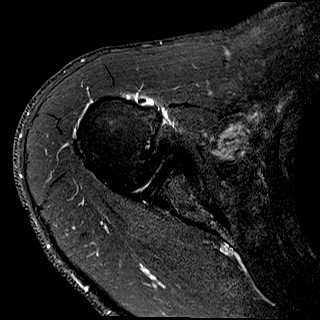
[im 11/27]
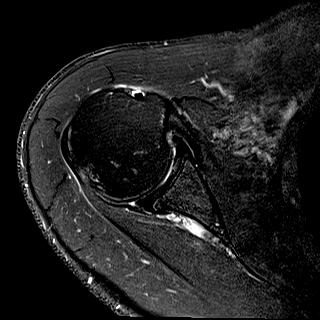
[im 14/27]
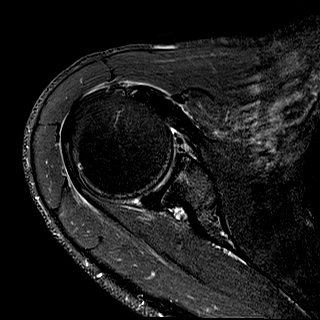
[im 16/27]
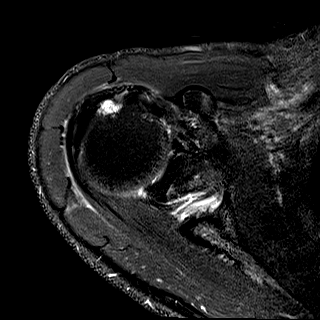
[im 24/27]
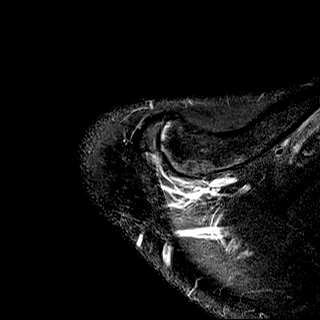

[Series 7: T2 fat-sat · oblique · right · 4.0mm · 0.22mm/px · 3 of 21 slices shown (2 of 3)]
[im 4/21]
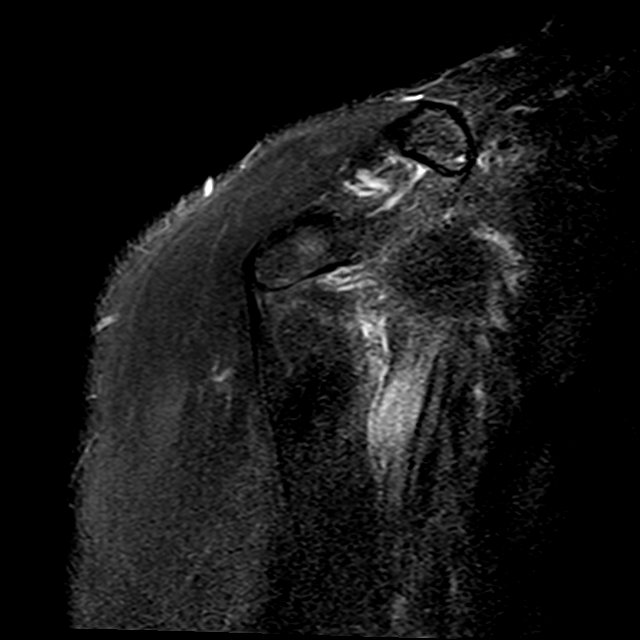
[im 11/21]
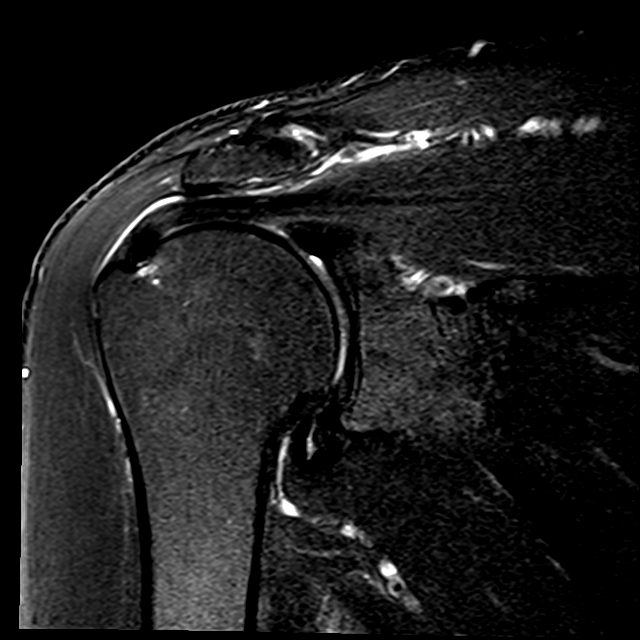
[im 17/21]
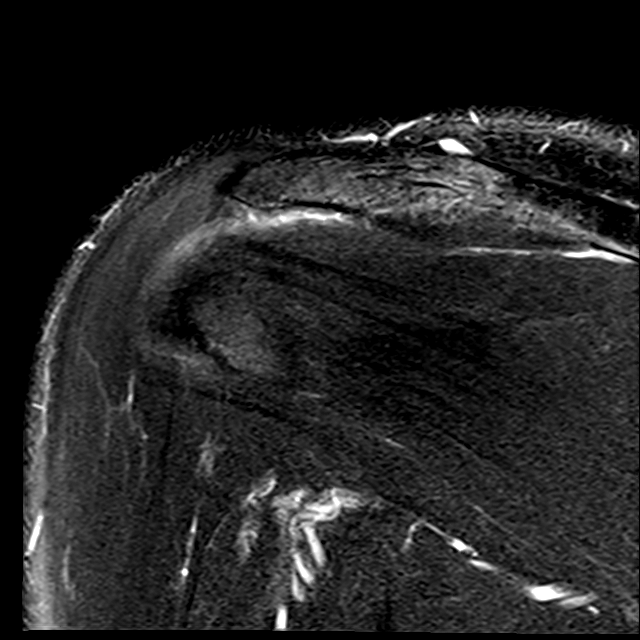

[Series 8: PD · oblique · right · 4.0mm · 0.22mm/px · 7 of 21 slices shown]
[im 1/21]
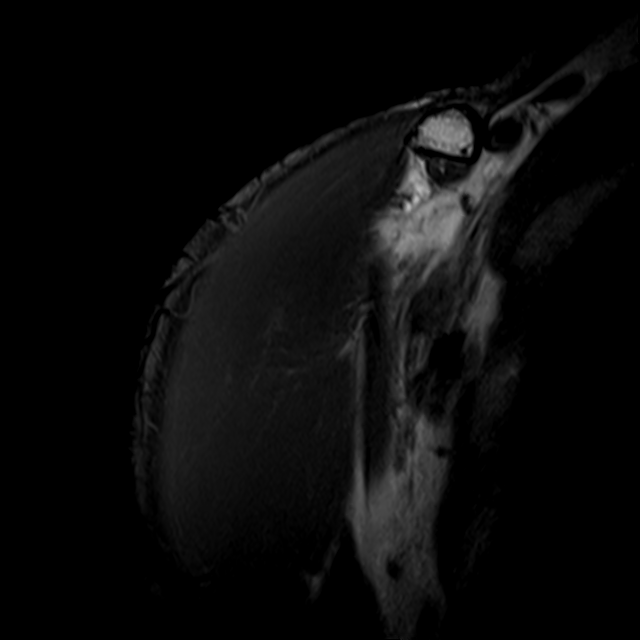
[im 4/21]
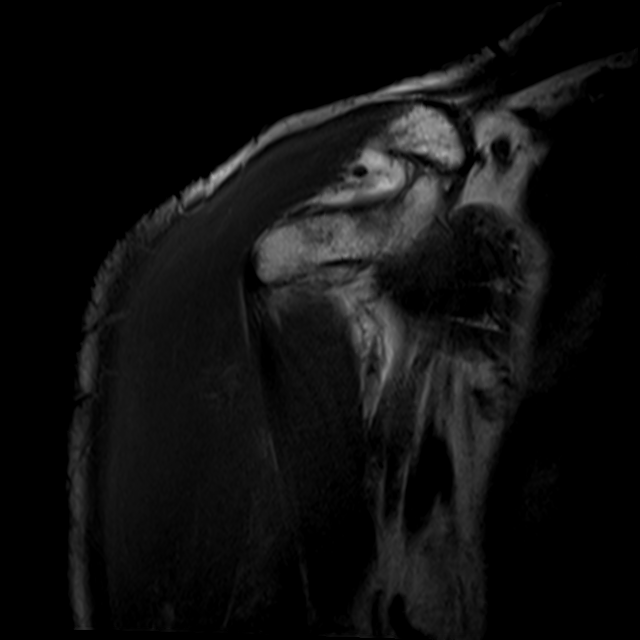
[im 7/21]
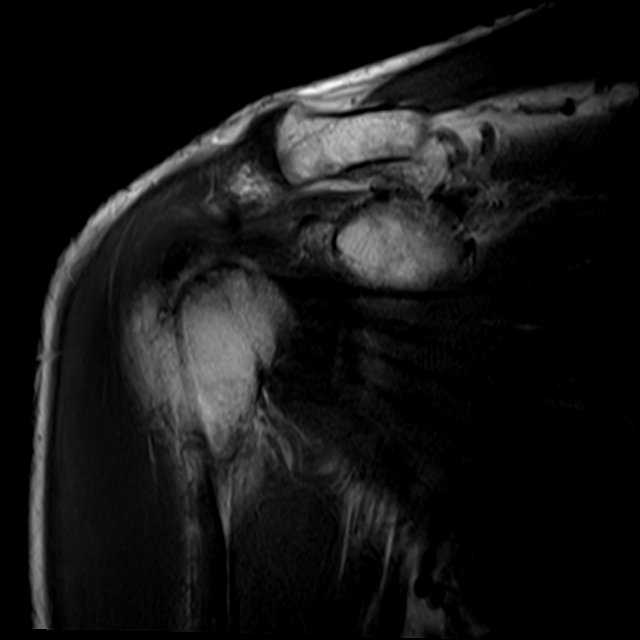
[im 11/21]
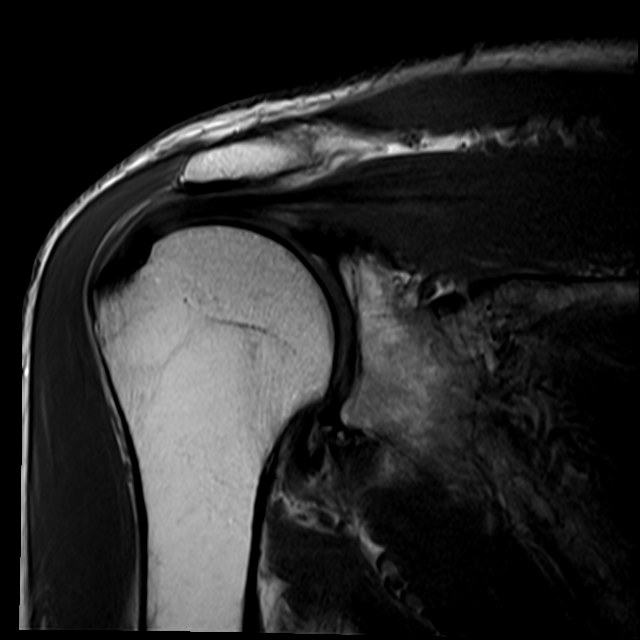
[im 14/21]
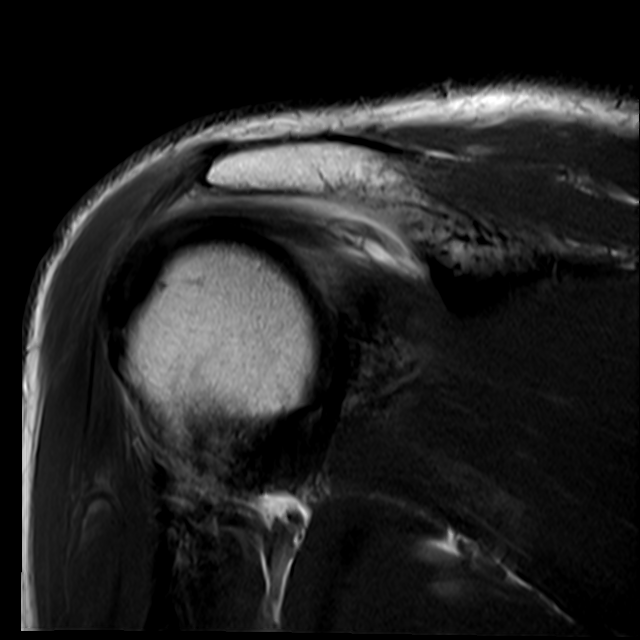
[im 17/21]
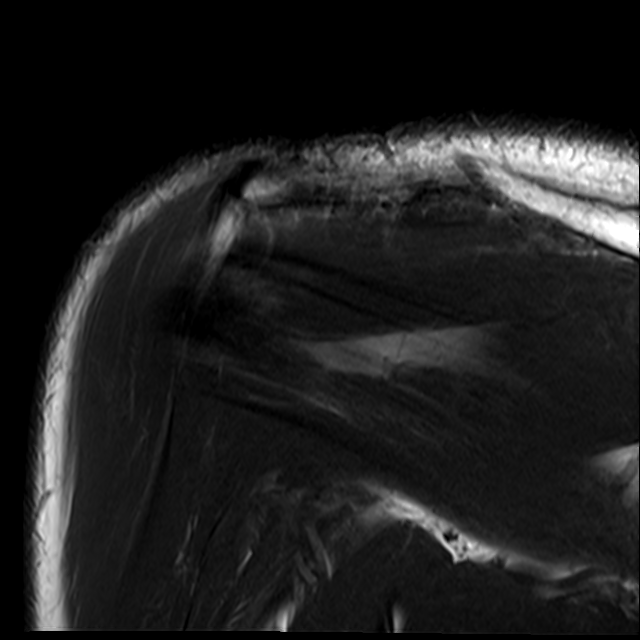
[im 21/21]
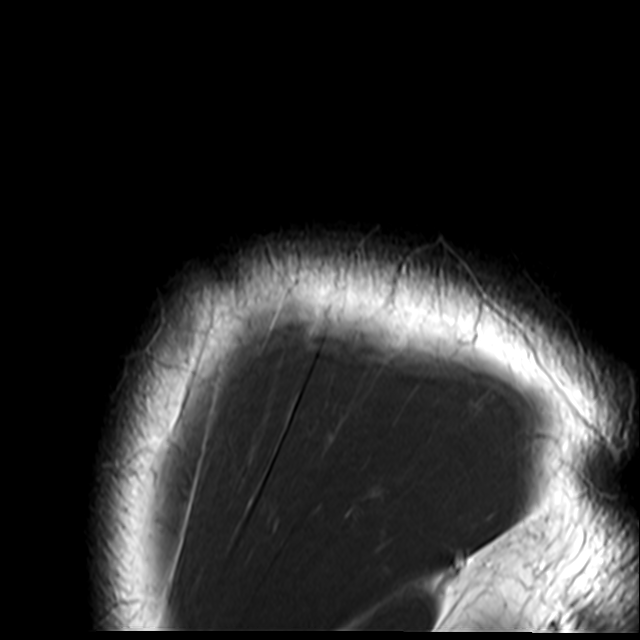

[Series 9: T2 fat-sat · sagittal · right · 4.0mm · 0.44mm/px · 3 of 23 slices shown (3 of 3)]
[im 4/23]
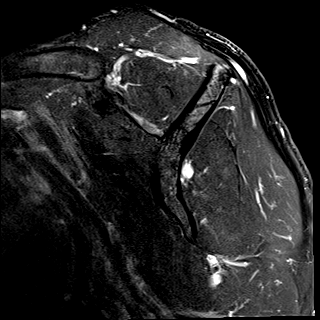
[im 13/23]
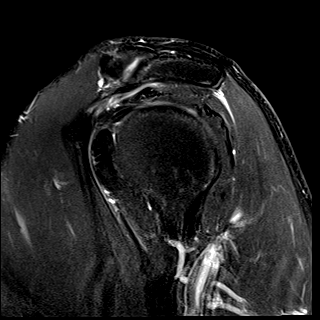
[im 19/23]
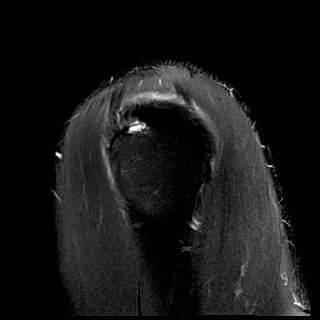

[21 of 40 positions shown; findings below may reference images not displayed]

FINDINGS: Rotator cuff: Moderate tendinosis of the supraspinatus tendon. Mild
tendinosis of the infraspinatus tendon. Teres minor tendon is
intact. Mild tendinosis of the subscapularis tendon.

Muscles: No muscle atrophy or edema. No intramuscular fluid
collection or hematoma.

Biceps Long Head: Mild tendinosis of the intra-articular portion of
the long head of the biceps tendon.

Acromioclavicular Joint: Mild arthropathy of the acromioclavicular
joint. No subacromial/subdeltoid bursal fluid.

Glenohumeral Joint: No joint effusion. No chondral defect.

Labrum: Grossly intact, but evaluation is limited by lack of
intraarticular fluid/contrast.

Bones: No fracture or dislocation. No aggressive osseous lesion.

Other: No fluid collection or hematoma.
IMPRESSION: 1. Moderate tendinosis of the supraspinatus tendon.
2. Mild tendinosis of the infraspinatus tendon.
3. Mild tendinosis of the subscapularis tendon.
4. Mild tendinosis of the intra-articular portion of the long head
of the biceps tendon.
